# Patient Record
Sex: Female | Born: 1955 | Race: White | Hispanic: No | Smoking: Former smoker
Health system: Southern US, Community
[De-identification: ages and names within clinical notes are randomized; demographics above are authoritative.]

## PROBLEM LIST (undated history)

## (undated) DIAGNOSIS — T7840XA Allergy, unspecified, initial encounter: Secondary | ICD-10-CM

## (undated) DIAGNOSIS — F419 Anxiety disorder, unspecified: Secondary | ICD-10-CM

## (undated) DIAGNOSIS — I10 Essential (primary) hypertension: Secondary | ICD-10-CM

## (undated) HISTORY — PX: TONSILLECTOMY: SUR1361

## (undated) HISTORY — DX: Essential (primary) hypertension: I10

## (undated) HISTORY — DX: Allergy, unspecified, initial encounter: T78.40XA

## (undated) HISTORY — DX: Anxiety disorder, unspecified: F41.9

---

## 2001-02-10 ENCOUNTER — Other Ambulatory Visit: Admission: RE | Admit: 2001-02-10 | Discharge: 2001-02-10 | Payer: Self-pay | Admitting: *Deleted

## 2001-12-17 LAB — LAB REPORT - SCANNED: TSH: 2.73 (ref 0.41–5.90)

## 2002-03-07 ENCOUNTER — Other Ambulatory Visit: Admission: RE | Admit: 2002-03-07 | Discharge: 2002-03-07 | Payer: Self-pay | Admitting: Obstetrics and Gynecology

## 2003-05-09 ENCOUNTER — Other Ambulatory Visit: Admission: RE | Admit: 2003-05-09 | Discharge: 2003-05-09 | Payer: Self-pay | Admitting: Obstetrics and Gynecology

## 2004-06-17 ENCOUNTER — Other Ambulatory Visit: Admission: RE | Admit: 2004-06-17 | Discharge: 2004-06-17 | Payer: Self-pay | Admitting: Obstetrics and Gynecology

## 2007-01-28 HISTORY — PX: POSTERIOR REPAIR: SHX2254

## 2007-06-01 ENCOUNTER — Ambulatory Visit (HOSPITAL_COMMUNITY): Admission: RE | Admit: 2007-06-01 | Discharge: 2007-06-02 | Payer: Self-pay | Admitting: Obstetrics and Gynecology

## 2010-06-11 NOTE — Op Note (Signed)
NAME:  Jane Obrien, Jane Obrien               ACCOUNT NO.:  [id number]   MEDICAL RECORD NO.:  [id number]          PATIENT TYPE:  OIB   LOCATION:  9310                          FACILITY:  [facility]   PHYSICIAN:  Michelle L. Grewal, M.D.DATE OF BIRTH:  03-28-1955   DATE OF PROCEDURE:  06/01/2007  DATE OF DISCHARGE:                               OPERATIVE REPORT   PREOPERATIVE DIAGNOSIS:  Symptomatic rectocele.   POSTOPERATIVE DIAGNOSIS:  Symptomatic rectocele.   PROCEDURE:  Posterior repair.   SURGEON:  Michelle L. Vincente Poli, MD   ANESTHESIA:  General.   FINDINGS:  Grade II-III rectocele.   PROCEDURE:  The patient was taken to the operating room after informed  consent was obtained.  She was intubated and prepped and draped in the  usual sterile fashion.  Exam under anesthesia revealed she had a grade  II-III rectocele.  She had no cystocele.  The cervix and uterus were  deep in the vagina.  After she was draped, Allis clamps were placed in  the 5 and 7 o'clock position on the perineum.  A V-shaped incision was  made with the scalpel and then a midline incision was made at the  posterior wall of the vagina using Metzenbaum scissors, dissecting the  overlying vaginal epithelium free from the rectovaginal fascia.  We then  found out with the Allis clamps the vaginal epithelium and then reduced  the rectocele bilaterally with sharp and blunt dissection.  There was  minimal bleeding noted with this.  At  this point, I then reduced the  rectocele using a series of a interrupted using 0 Vicryl suture starting  distally and proximally to the introitus.  The redundant vaginal  epithelium was then trimmed carefully with Metzenbaum scissors.  The  vaginal epithelium was closed using 2-0 Vicryl in a running locked  stitch and the perineum was reapproximated as well.  There was no  bleeding noted at the end of the procedure.  She had excellent reduction  of the rectocele at the time of conclusion of the  surgery.  Vaginal  packing 2-inch tape with Estrace cream was carefully inserted into the  vagina.  She will wear this overnight.  I will keep her for overnight  observation as discussed with the patient previously and she will go  home tomorrow.      Michelle L. Vincente Poli, M.D.  Electronically Signed     MLG/MEDQ  D:  06/01/2007  T:  06/01/2007  Job:  [id number]

## 2011-02-04 ENCOUNTER — Other Ambulatory Visit (HOSPITAL_COMMUNITY): Payer: Self-pay | Admitting: Obstetrics and Gynecology

## 2011-02-04 DIAGNOSIS — Z1231 Encounter for screening mammogram for malignant neoplasm of breast: Secondary | ICD-10-CM

## 2011-03-05 ENCOUNTER — Ambulatory Visit (HOSPITAL_COMMUNITY)
Admission: RE | Admit: 2011-03-05 | Discharge: 2011-03-05 | Disposition: A | Discharge status: Home / Self Care | Payer: Self-pay | Source: Ambulatory Visit | Attending: Obstetrics and Gynecology | Admitting: Obstetrics and Gynecology

## 2011-03-05 DIAGNOSIS — Z1231 Encounter for screening mammogram for malignant neoplasm of breast: Secondary | ICD-10-CM

## 2012-05-20 ENCOUNTER — Other Ambulatory Visit (HOSPITAL_COMMUNITY): Payer: Self-pay | Admitting: Obstetrics and Gynecology

## 2012-05-20 DIAGNOSIS — Z1231 Encounter for screening mammogram for malignant neoplasm of breast: Secondary | ICD-10-CM

## 2012-05-26 ENCOUNTER — Ambulatory Visit (HOSPITAL_COMMUNITY): Payer: Self-pay

## 2012-06-03 ENCOUNTER — Other Ambulatory Visit: Payer: Self-pay | Admitting: Obstetrics and Gynecology

## 2012-06-03 ENCOUNTER — Ambulatory Visit (HOSPITAL_COMMUNITY)
Admission: RE | Admit: 2012-06-03 | Discharge: 2012-06-03 | Disposition: A | Discharge status: Home / Self Care | Payer: Self-pay | Source: Ambulatory Visit | Attending: Obstetrics and Gynecology | Admitting: Obstetrics and Gynecology

## 2012-06-03 DIAGNOSIS — Z1231 Encounter for screening mammogram for malignant neoplasm of breast: Secondary | ICD-10-CM

## 2012-06-03 DIAGNOSIS — R928 Other abnormal and inconclusive findings on diagnostic imaging of breast: Secondary | ICD-10-CM

## 2012-06-22 ENCOUNTER — Encounter (HOSPITAL_COMMUNITY): Payer: Self-pay

## 2012-06-22 ENCOUNTER — Ambulatory Visit (HOSPITAL_COMMUNITY)
Admission: RE | Admit: 2012-06-22 | Discharge: 2012-06-22 | Disposition: A | Discharge status: Home / Self Care | Payer: Self-pay | Source: Ambulatory Visit | Attending: Obstetrics and Gynecology | Admitting: Obstetrics and Gynecology

## 2012-06-22 VITALS — BP 124/78 | Temp 98.1°F | Ht 61.5 in | Wt 175.0 lb

## 2012-06-22 DIAGNOSIS — N6325 Unspecified lump in the left breast, overlapping quadrants: Secondary | ICD-10-CM | POA: Insufficient documentation

## 2012-06-22 DIAGNOSIS — Z1239 Encounter for other screening for malignant neoplasm of breast: Secondary | ICD-10-CM

## 2012-06-22 NOTE — Progress Notes (Signed)
Patient referred to [facility] by the [facility] due to needing additional imaging of the left breast. Screening mammogram completed 06/03/2012 at the [facility].  Pap Smear:    Pap smear not completed today. Last Pap smear was December 2012 at [facility] and normal per patient. Per patient has a history of an abnormal Pap smear 20 years ago that required cryo therapy for follow up. Per patient all Pap smears have been normal since cryo was completed. No Pap smear results in EPIC. Patient has signed a release for previous Pap smear results to determine when next Pap smear is due.  Physical exam: Breasts Breasts symmetrical. No skin abnormalities bilateral breasts. No nipple retraction bilateral breasts. No nipple discharge bilateral breasts. No lymphadenopathy. No lumps palpated right breast. Palpated lump within the left breast at 9 o'clock 6 cm from the nipple. Patient complained of pain and tenderness on exam over entire breast. She stated her right breast was more tender. Unable to complete a thorough breast exam due to patients complaints of breast tenderness on exam. Referred patient to the [facility] of [facility] for left breast diagnostic mammogram and possible ultrasound per recommendation. Appointment scheduled for Wednesday, Jun 23, 2012 at 0920.      Pelvic/Bimanual No Pap smear completed today since last Pap smear was December 2012. Pap smear not indicated per BCCCP guidelines.

## 2012-06-22 NOTE — Patient Instructions (Signed)
Taught patient how to perform BSE. Patient did not need a Pap smear today due to last Pap smear was December 2012 per patient. Patient has signed release to get results of last Pap smear. Let patient know will follow up with her when Pap smear is due based on her history. Referred patient to the [facility] of [facility] for left breast diagnostic mammogram and possible ultrasound per recommendation. Appointment scheduled for Wednesday, Jun 23, 2012 at 0920. Patient aware of appointment and will be there. Smoking cessation discussed with patient and gave her information of upcoming smoking cessation classes offered at the [facility]. Patient verbalized understanding.

## 2012-06-23 ENCOUNTER — Ambulatory Visit
Admission: RE | Admit: 2012-06-23 | Discharge: 2012-06-23 | Disposition: A | Discharge status: Home / Self Care | Payer: No Typology Code available for payment source | Source: Ambulatory Visit | Attending: Obstetrics and Gynecology | Admitting: Obstetrics and Gynecology

## 2012-06-23 DIAGNOSIS — R928 Other abnormal and inconclusive findings on diagnostic imaging of breast: Secondary | ICD-10-CM

## 2012-11-17 ENCOUNTER — Other Ambulatory Visit: Payer: Self-pay | Admitting: Obstetrics and Gynecology

## 2012-11-17 DIAGNOSIS — R921 Mammographic calcification found on diagnostic imaging of breast: Secondary | ICD-10-CM

## 2012-12-27 ENCOUNTER — Other Ambulatory Visit: Payer: Self-pay | Admitting: Obstetrics and Gynecology

## 2012-12-27 ENCOUNTER — Ambulatory Visit
Admission: RE | Admit: 2012-12-27 | Discharge: 2012-12-27 | Disposition: A | Discharge status: Home / Self Care | Payer: No Typology Code available for payment source | Source: Ambulatory Visit | Attending: Obstetrics and Gynecology | Admitting: Obstetrics and Gynecology

## 2012-12-27 DIAGNOSIS — R921 Mammographic calcification found on diagnostic imaging of breast: Secondary | ICD-10-CM

## 2012-12-28 ENCOUNTER — Ambulatory Visit
Admission: RE | Admit: 2012-12-28 | Discharge: 2012-12-28 | Disposition: A | Discharge status: Home / Self Care | Payer: No Typology Code available for payment source | Source: Ambulatory Visit | Attending: Obstetrics and Gynecology | Admitting: Obstetrics and Gynecology

## 2012-12-28 DIAGNOSIS — R921 Mammographic calcification found on diagnostic imaging of breast: Secondary | ICD-10-CM

## 2013-01-18 ENCOUNTER — Other Ambulatory Visit: Payer: Self-pay | Admitting: Diagnostic Radiology

## 2013-01-18 ENCOUNTER — Ambulatory Visit
Admission: RE | Admit: 2013-01-18 | Discharge: 2013-01-18 | Disposition: A | Discharge status: Home / Self Care | Payer: No Typology Code available for payment source | Source: Ambulatory Visit | Attending: Obstetrics and Gynecology | Admitting: Obstetrics and Gynecology

## 2013-01-18 ENCOUNTER — Other Ambulatory Visit: Payer: Self-pay | Admitting: Obstetrics and Gynecology

## 2013-01-18 DIAGNOSIS — Z9889 Other specified postprocedural states: Secondary | ICD-10-CM

## 2013-01-18 MED ORDER — DOXYCYCLINE HYCLATE 50 MG PO CAPS: 50.0000 mg | ORAL_CAPSULE | Freq: Two times a day (BID) | ORAL | Status: DC

## 2013-01-24 ENCOUNTER — Ambulatory Visit
Admission: RE | Admit: 2013-01-24 | Discharge: 2013-01-24 | Disposition: A | Discharge status: Home / Self Care | Payer: No Typology Code available for payment source | Source: Ambulatory Visit | Attending: Obstetrics and Gynecology | Admitting: Obstetrics and Gynecology

## 2013-01-24 ENCOUNTER — Other Ambulatory Visit: Payer: Self-pay | Admitting: Obstetrics and Gynecology

## 2013-01-24 DIAGNOSIS — Z9889 Other specified postprocedural states: Secondary | ICD-10-CM

## 2013-02-04 ENCOUNTER — Ambulatory Visit
Admission: RE | Admit: 2013-02-04 | Discharge: 2013-02-04 | Disposition: A | Discharge status: Home / Self Care | Payer: No Typology Code available for payment source | Source: Ambulatory Visit | Attending: Obstetrics and Gynecology | Admitting: Obstetrics and Gynecology

## 2013-02-04 DIAGNOSIS — Z9889 Other specified postprocedural states: Secondary | ICD-10-CM

## 2013-11-15 ENCOUNTER — Other Ambulatory Visit: Payer: Self-pay | Admitting: Obstetrics and Gynecology

## 2013-11-16 LAB — CYTOLOGY - PAP

## 2013-11-28 ENCOUNTER — Encounter (HOSPITAL_COMMUNITY): Payer: Self-pay

## 2015-04-01 IMAGING — MG MM DIAGNOSTIC UNILATERAL L
6 series · 6 of 6 positions shown · non-contrast
Comparison: 06/23/2012, 06/03/2012, 03/05/2011, 11/17/2008

ACR Breast Density Category a: The breast tissue is almost entirely
fatty.

CLINICAL DATA: Patient returns for short interval followup of
calcifications in the left breast.

EXAM:
DIGITAL DIAGNOSTIC  LEFT MAMMOGRAM WITH CAD

[L ML (1 of 2)]
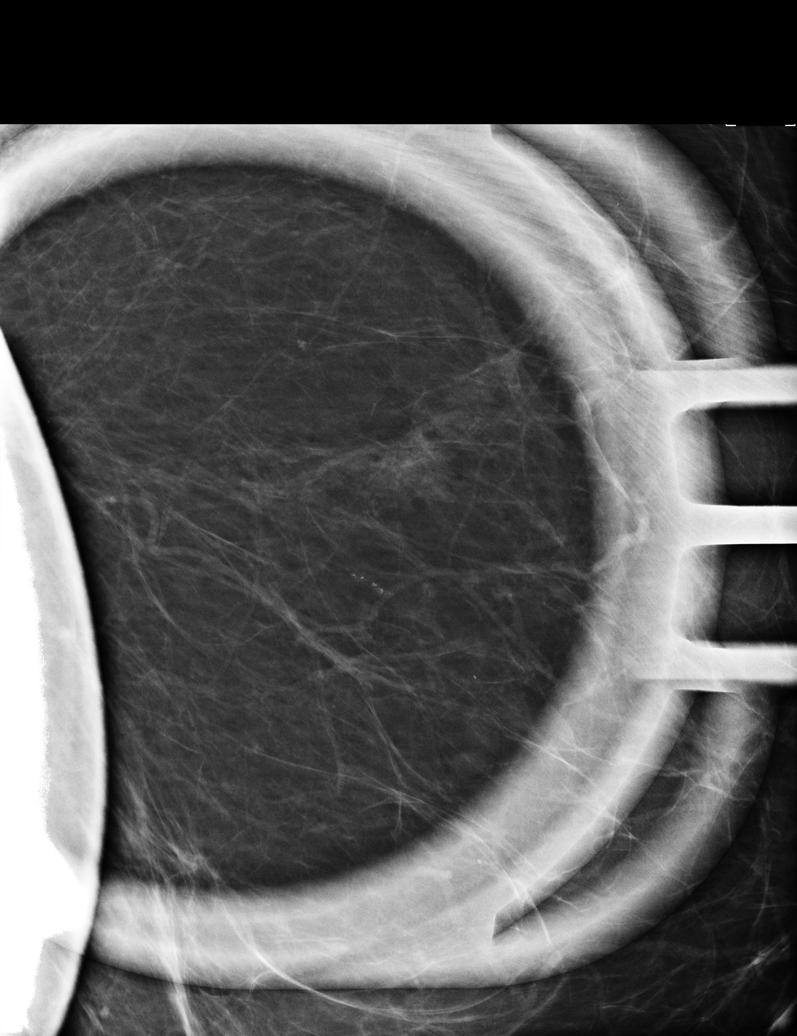

[L CC (1 of 2)]
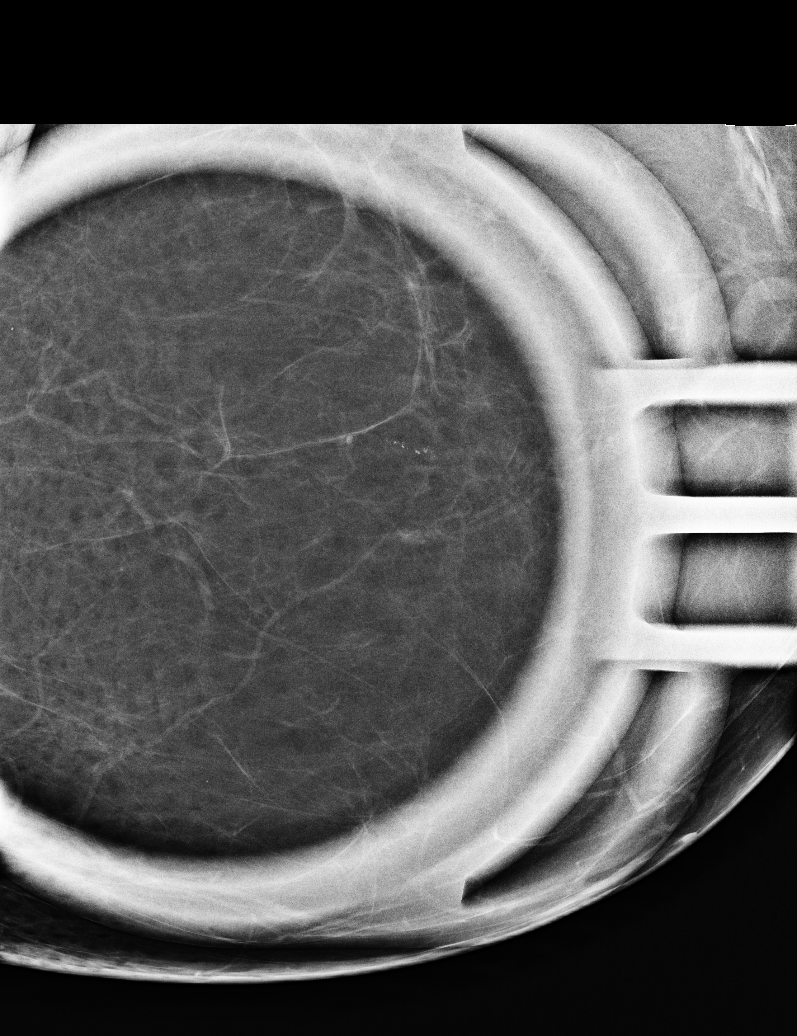

[L ML (2 of 2)]
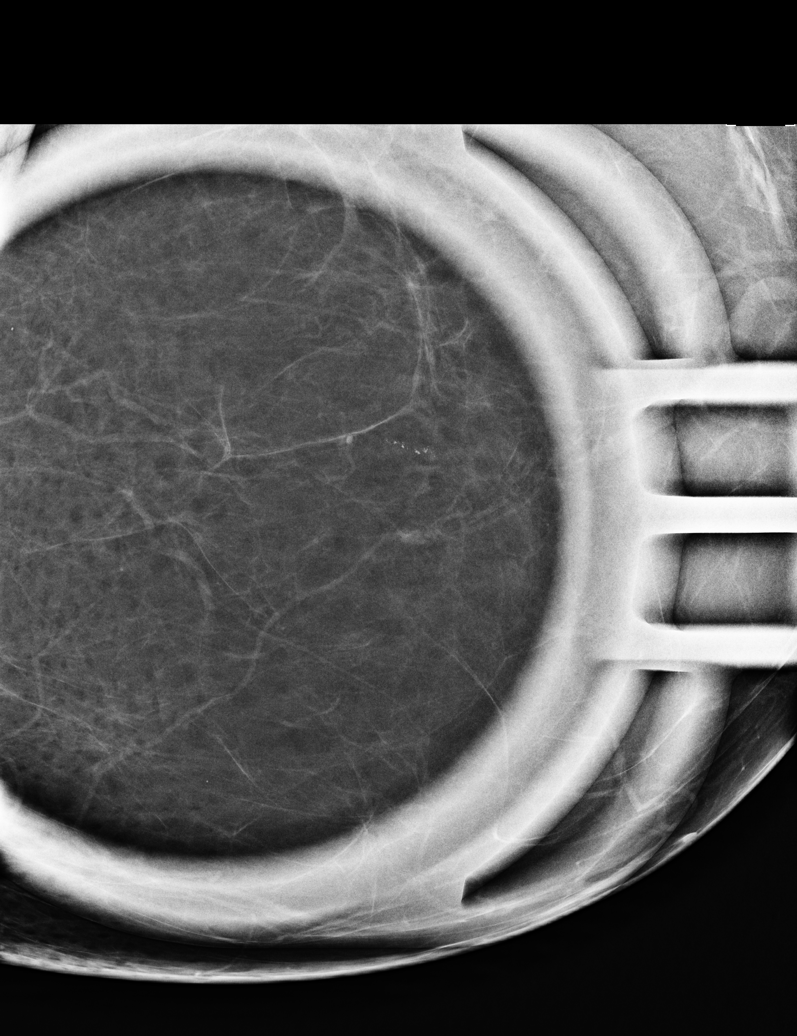

[L MLO (1 of 2)]
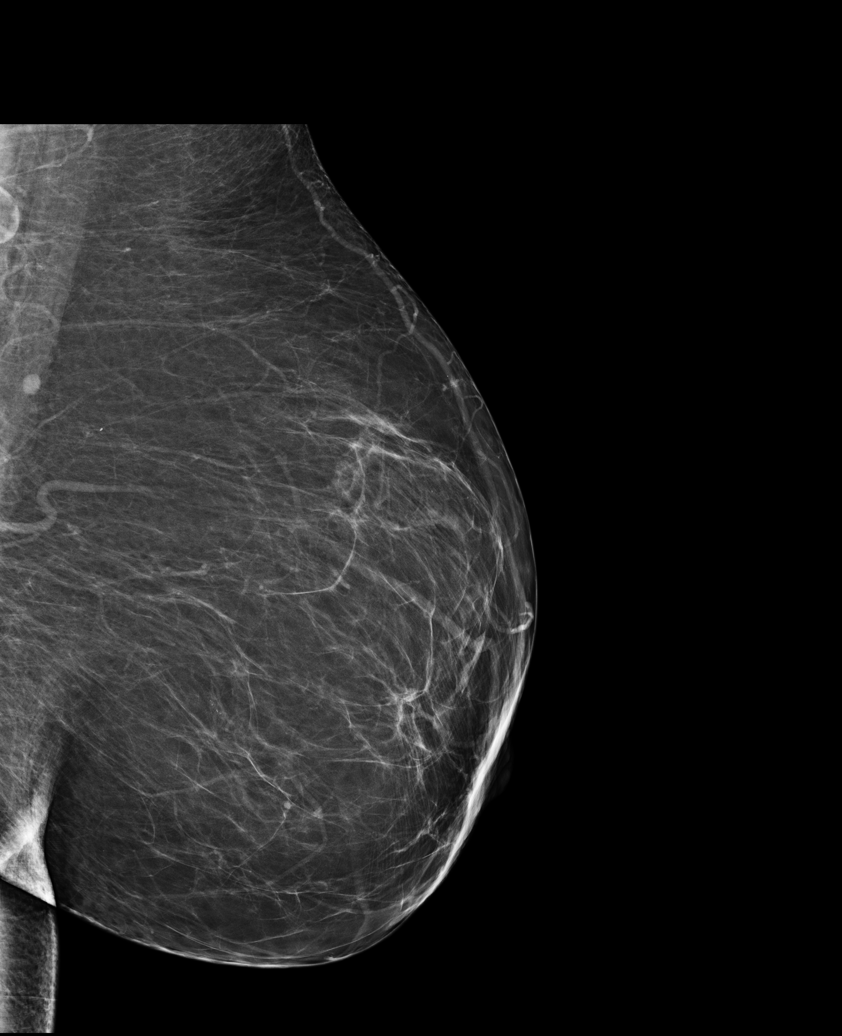

[L MLO (2 of 2)]
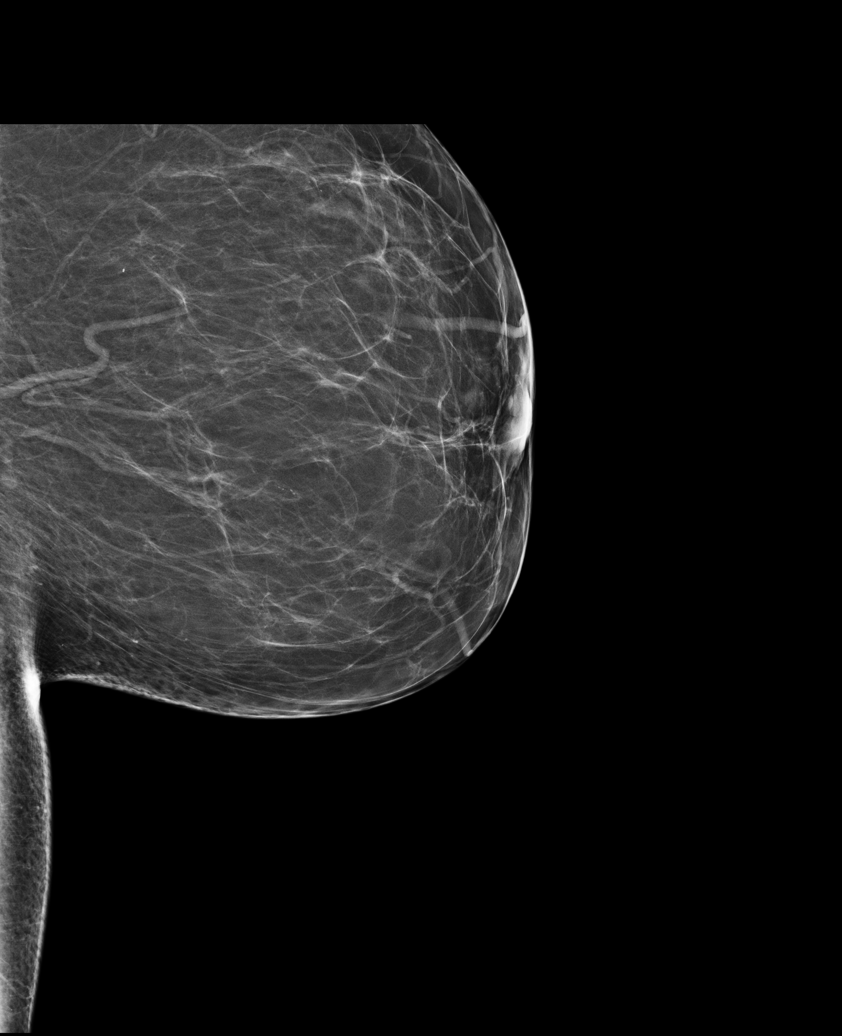

[L CC (2 of 2)]
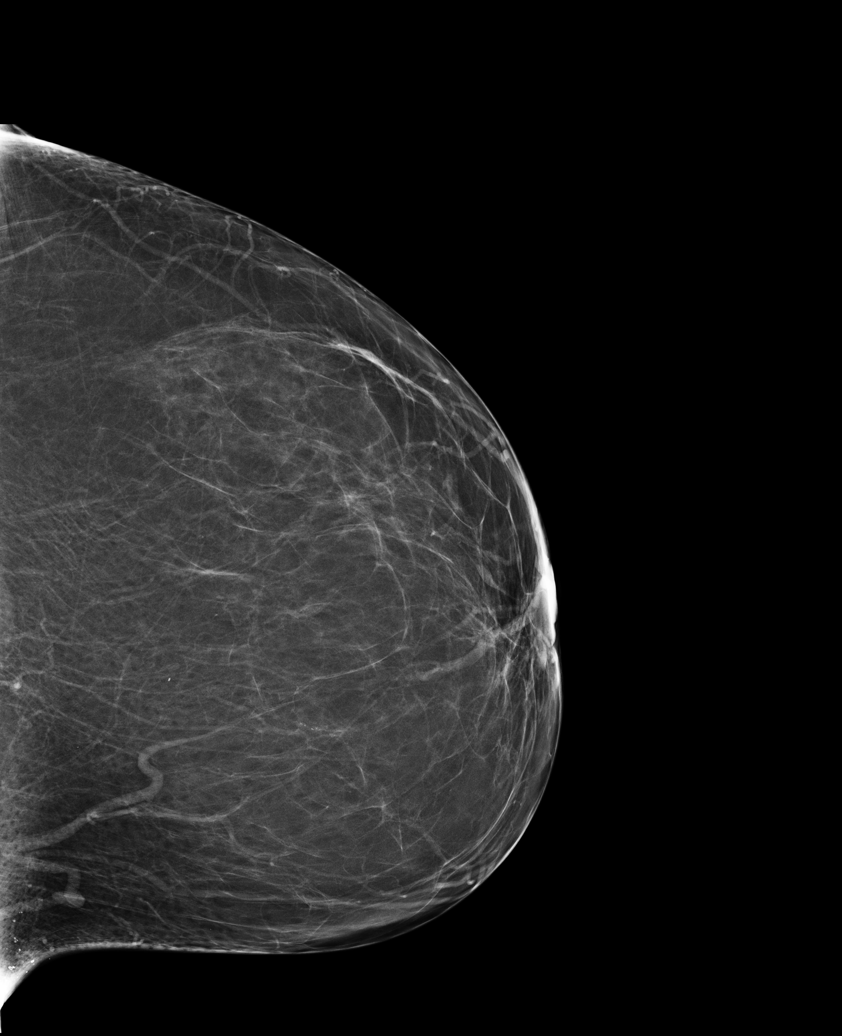

[6 of 6 positions shown; findings below may reference images not displayed]

FINDINGS: There are pleomorphic microcalcifications in a linear orientation in
the medial aspect of the left breast in the middle third. These do
not appear to be associated with a vessel on magnification views.
These cover an area of 7 x 1 x 1 mm. While this may represent a
degenerating fibroadenoma, ductal carcinoma in situ is not excluded.
Biopsy is recommended. Stereotactic core needle biopsy was discussed
with the patient and she agreed with this plan.

Mammographic images were processed with CAD.
IMPRESSION: Slightly suspicious calcifications in the left breast medially in
the middle third.

RECOMMENDATION:
Stereotactic core needle biopsy is suggested. This has been
scheduled for 12/28/2012 at 1 p.m..

I have discussed the findings and recommendations with the patient.
Results were also provided in writing at the conclusion of the
visit. If applicable, a reminder letter will be sent to the patient
regarding the next appointment.

BI-RADS CATEGORY  4: Suspicious abnormality - biopsy should be
considered.

## 2015-04-02 IMAGING — MG MM BX 1ST LESION STEREO
1 series · 1 of 1 positions shown · non-contrast
Comparison: Previous exams.

ADDENDUM:
Pathology revealed hyalinized and calcified fibroadenomatoid nodules
with calcifications in the left breast. This was found to be
concordant by Dr. Sahir Nation. Pathology was relayed to the
patient by telephone. The patient reported tenderness and bruising
at the biopsy site. Post biopsy instructions were reviewed and her
questions were answered. She was encouraged to call [facility]
asked to return in Monday May, 2013 for screening mammography.

Pathology results were reported by Liam Reno Jankins RN, BSN on [REDACTED]AL DATA:  Patient presents for stereotactic core needle biopsy
of a 7 mm group of indeterminate microcalcifications over the inner
mid left breast.
EXAM:
Left STEREOTACTIC CORE NEEDLE BIOPSY

[L ML]
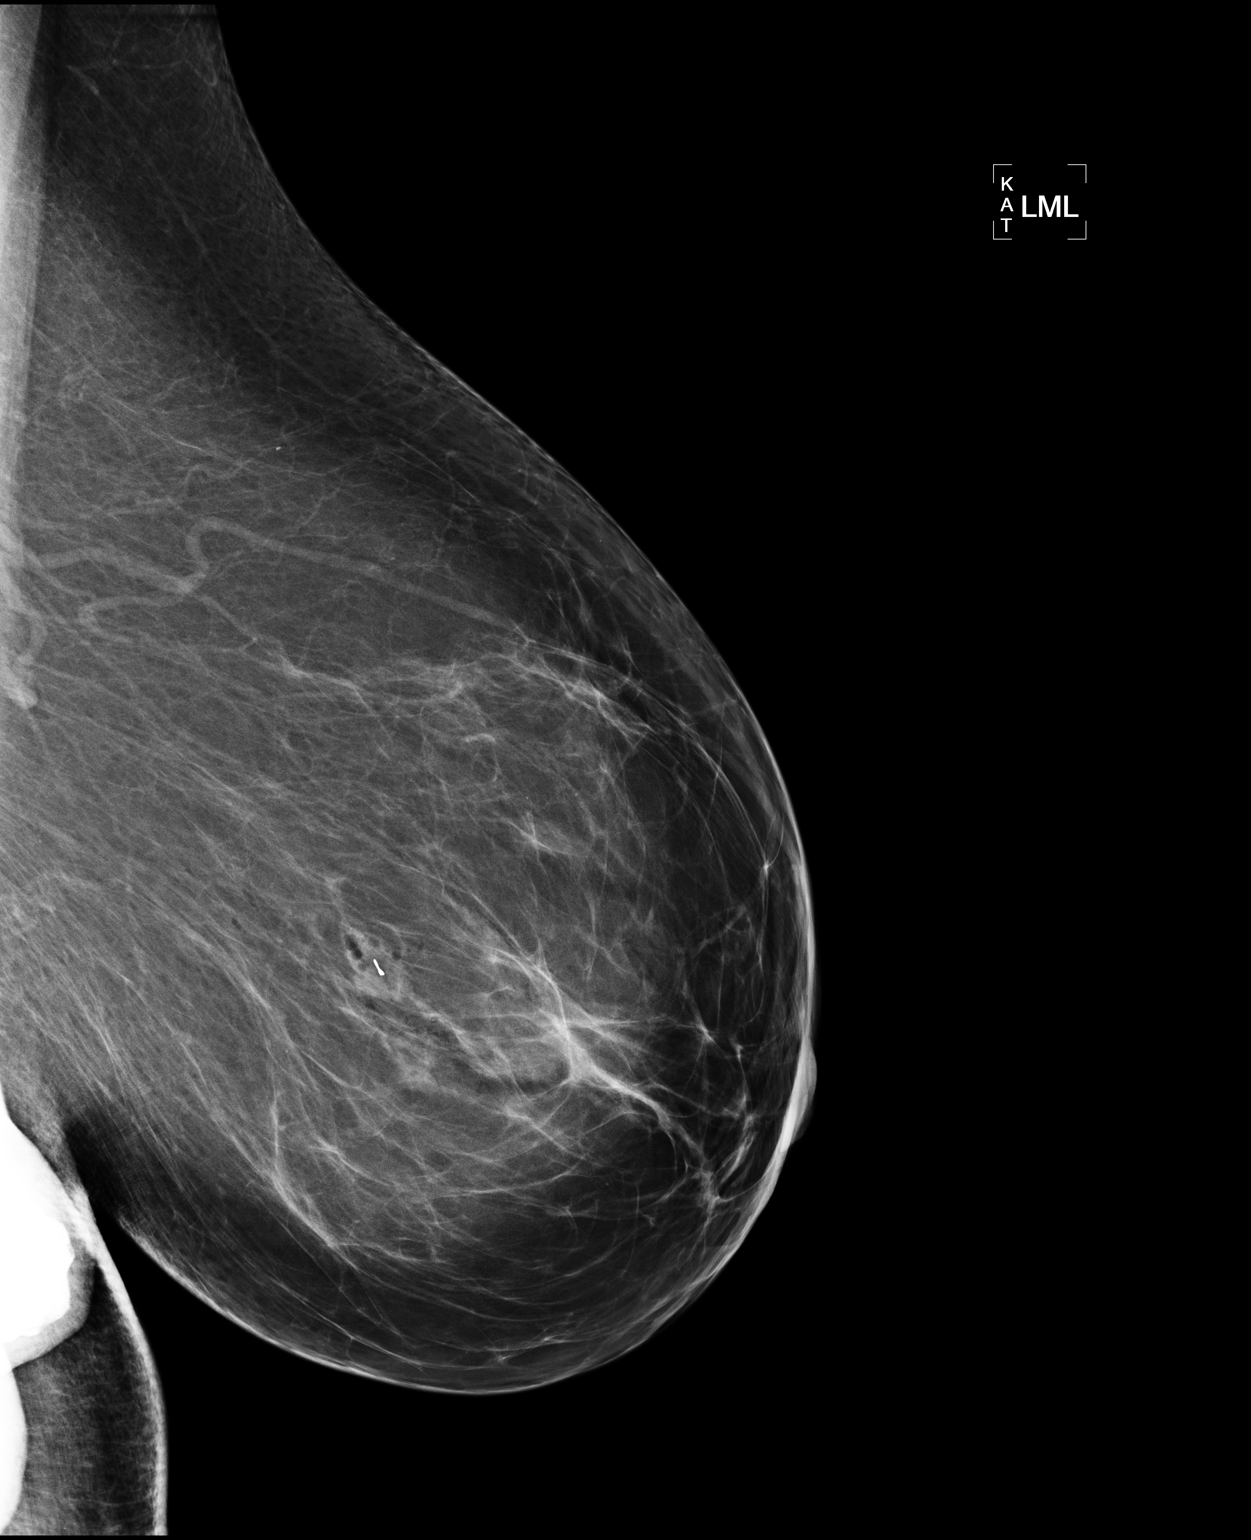

[1 of 1 positions shown; findings below may reference images not displayed]



Using sterile technique and 2% Lidocaine as local anesthetic, under
stereotactic guidance, a 10 gauge vacuum assisted needle device was
used to perform core needle biopsy of microcalcifications over the
inner mid left breast using a medial to lateral approach. Specimen
radiograph was performed showing multiple of the targeted
microcalcifications. Specimens with calcifications are identified
for pathology.

At the conclusion of the procedure, a dumbbell-shaped tissue marker
clip was deployed into the biopsy cavity. Follow-up 2-view mammogram
confirmed clip placement accurately at the biopsy site.
IMPRESSION: Stereotactic-guided biopsy of indeterminate left breast
microcalcifications. No apparent complications.

## 2016-01-18 LAB — LAB REPORT - SCANNED
Albumin, Urine POC: 0.7
Albumin/Creatinine Ratio, Urine, POC: UNDETERMINED
Creatinine, POC: 32 mg/dL
EGFR (Non-African Amer.): 70

## 2019-04-02 ENCOUNTER — Ambulatory Visit: Payer: Self-pay | Attending: Internal Medicine

## 2019-04-02 DIAGNOSIS — Z23 Encounter for immunization: Secondary | ICD-10-CM | POA: Insufficient documentation

## 2019-04-02 NOTE — Progress Notes (Signed)
   Covid-19 Vaccination Clinic  Name:  Jane Obrien    MRN: [id number] DOB: 1955-09-12  04/02/2019  [name] was observed post Covid-19 immunization for 30 minutes based on pre-vaccination screening without incident. She was provided with Vaccine Information Sheet and instruction to access the V-Safe system.   [name] was instructed to call 911 with any severe reactions post vaccine: Marland Kitchen Difficulty breathing  . Swelling of face and throat  . A fast heartbeat  . A bad rash all over body  . Dizziness and weakness   Immunizations Administered    Name Date Dose VIS Date Route   Pfizer COVID-19 Vaccine 04/02/2019  1:36 PM 0.3 mL 01/07/2019 Intramuscular   Manufacturer: [employer], [employer]   Lot: [id number]   NDC: [address]-2

## 2019-04-23 ENCOUNTER — Ambulatory Visit: Payer: Self-pay | Attending: Internal Medicine

## 2019-04-23 DIAGNOSIS — Z23 Encounter for immunization: Secondary | ICD-10-CM

## 2019-04-23 NOTE — Progress Notes (Signed)
   Covid-19 Vaccination Clinic  Name:  DALLYS NOWAKOWSKI    MRN: [id number] DOB: 1955/11/14  04/23/2019  Ms. Stegemann was observed post Covid-19 immunization for 15 minutes without incident. She was provided with Vaccine Information Sheet and instruction to access the V-Safe system.   Ms. Carrol was instructed to call 911 with any severe reactions post vaccine: Marland Kitchen Difficulty breathing  . Swelling of face and throat  . A fast heartbeat  . A bad rash all over body  . Dizziness and weakness   Immunizations Administered    Name Date Dose VIS Date Route   Pfizer COVID-19 Vaccine 04/23/2019  1:02 PM 0.3 mL 01/07/2019 Intramuscular   Manufacturer: [employer], [employer]   Lot: [id number]   NDC: [address]-7

## 2019-05-02 ENCOUNTER — Ambulatory Visit: Payer: Self-pay

## 2019-05-03 ENCOUNTER — Ambulatory Visit: Payer: Self-pay

## 2019-11-12 ENCOUNTER — Ambulatory Visit: Payer: Self-pay | Attending: Internal Medicine

## 2019-11-12 DIAGNOSIS — Z23 Encounter for immunization: Secondary | ICD-10-CM

## 2019-11-12 NOTE — Progress Notes (Signed)
   Covid-19 Vaccination Clinic  Name:  Jane Obrien    MRN: [id number] DOB: 14-Dec-1955  11/12/2019  Jane Obrien was observed post Covid-19 immunization for 15 minutes without incident. She was provided with Vaccine Information Sheet and instruction to access the V-Safe system.   Jane Obrien was instructed to call 911 with any severe reactions post vaccine: Marland Kitchen Difficulty breathing  . Swelling of face and throat  . A fast heartbeat  . A bad rash all over body  . Dizziness and weakness

## 2020-01-17 LAB — COLOGUARD: Cologuard: NEGATIVE

## 2020-01-24 LAB — COLOGUARD: COLOGUARD: NEGATIVE

## 2020-01-24 LAB — EXTERNAL GENERIC LAB PROCEDURE: COLOGUARD: NEGATIVE

## 2022-05-14 ENCOUNTER — Inpatient Hospital Stay: Payer: Medicare Other | Attending: Hematology & Oncology

## 2022-05-14 ENCOUNTER — Encounter: Payer: Self-pay | Admitting: Family

## 2022-05-14 ENCOUNTER — Other Ambulatory Visit: Payer: Self-pay | Admitting: Family

## 2022-05-14 ENCOUNTER — Other Ambulatory Visit: Payer: Self-pay

## 2022-05-14 ENCOUNTER — Inpatient Hospital Stay (HOSPITAL_BASED_OUTPATIENT_CLINIC_OR_DEPARTMENT_OTHER): Payer: Medicare Other | Admitting: Family

## 2022-05-14 VITALS — BP 109/72 | HR 80 | Temp 98.5°F | Resp 17 | Ht 62.0 in | Wt 154.4 lb

## 2022-05-14 DIAGNOSIS — R1011 Right upper quadrant pain: Secondary | ICD-10-CM | POA: Diagnosis not present

## 2022-05-14 DIAGNOSIS — R7989 Other specified abnormal findings of blood chemistry: Secondary | ICD-10-CM | POA: Diagnosis not present

## 2022-05-14 DIAGNOSIS — Z87891 Personal history of nicotine dependence: Secondary | ICD-10-CM

## 2022-05-14 DIAGNOSIS — Z800 Family history of malignant neoplasm of digestive organs: Secondary | ICD-10-CM

## 2022-05-14 DIAGNOSIS — F1010 Alcohol abuse, uncomplicated: Secondary | ICD-10-CM | POA: Insufficient documentation

## 2022-05-14 DIAGNOSIS — F1011 Alcohol abuse, in remission: Secondary | ICD-10-CM | POA: Diagnosis not present

## 2022-05-14 LAB — CBC WITH DIFFERENTIAL (CANCER CENTER ONLY)
Abs Immature Granulocytes: 0.02 10*3/uL (ref 0.00–0.07)
Basophils Absolute: 0.1 10*3/uL (ref 0.0–0.1)
Basophils Relative: 1 %
Eosinophils Absolute: 0.2 10*3/uL (ref 0.0–0.5)
Eosinophils Relative: 3 %
HCT: 38 % (ref 36.0–46.0)
Hemoglobin: 12.9 g/dL (ref 12.0–15.0)
Immature Granulocytes: 0 %
Lymphocytes Relative: 31 %
Lymphs Abs: 2.1 10*3/uL (ref 0.7–4.0)
MCH: 30.8 pg (ref 26.0–34.0)
MCHC: 33.9 g/dL (ref 30.0–36.0)
MCV: 90.7 fL (ref 80.0–100.0)
Monocytes Absolute: 0.7 10*3/uL (ref 0.1–1.0)
Monocytes Relative: 10 %
Neutro Abs: 3.7 10*3/uL (ref 1.7–7.7)
Neutrophils Relative %: 55 %
Platelet Count: 245 10*3/uL (ref 150–400)
RBC: 4.19 MIL/uL (ref 3.87–5.11)
RDW: 12.3 % (ref 11.5–15.5)
WBC Count: 6.6 10*3/uL (ref 4.0–10.5)
nRBC: 0 % (ref 0.0–0.2)

## 2022-05-14 LAB — CMP (CANCER CENTER ONLY)
ALT: 12 U/L (ref 0–44)
AST: 16 U/L (ref 15–41)
Albumin: 4.4 g/dL (ref 3.5–5.0)
Alkaline Phosphatase: 60 U/L (ref 38–126)
Anion gap: 10 (ref 5–15)
BUN: 12 mg/dL (ref 8–23)
CO2: 28 mmol/L (ref 22–32)
Calcium: 9.7 mg/dL (ref 8.9–10.3)
Chloride: 103 mmol/L (ref 98–111)
Creatinine: 1.03 mg/dL — ABNORMAL HIGH (ref 0.44–1.00)
GFR, Estimated: 60 mL/min — ABNORMAL LOW (ref >60)
Glucose, Bld: 98 mg/dL (ref 70–99)
Potassium: 4.1 mmol/L (ref 3.5–5.1)
Sodium: 141 mmol/L (ref 135–145)
Total Bilirubin: 0.8 mg/dL (ref 0.3–1.2)
Total Protein: 7.4 g/dL (ref 6.5–8.1)

## 2022-05-14 LAB — LACTATE DEHYDROGENASE: LDH: 132 U/L (ref 98–192)

## 2022-05-14 LAB — FERRITIN: Ferritin: 677 ng/mL — ABNORMAL HIGH (ref 11–307)

## 2022-05-14 NOTE — Progress Notes (Signed)
Hematology/Oncology Consultation   Name: Jane Obrien      MRN: [id number]    Location: Room/bed info not found  Date: 05/14/2022 Time:3:22 PM   REFERRING PHYSICIAN:  Marcelle Overlie, MD  REASON FOR CONSULT: High serum ferritin    DIAGNOSIS: High serum ferritin   HISTORY OF PRESENT ILLNESS: Jane Obrien is a very pleasant 67 yo caucasian female with recently elevated ferritin.  She had been having hair thinning and on work up with iron studies and TSH showed a ferritin of 834. On repeat 2 months later her ferritin was up to 1288.  She is not on any iron supplementation.  Hgb is stable at 12.9, MCV 90, platelets 245 an WBC count 6.6.  She has history of ETOH abuse but quit 2 months ago. We are so proud of her for making this decision.  She notes occasional twinges of abdominal pain in the right upper quadrant.  No known personal or familial history of liver disease or thrombotic event. She notes some SOB and occasional palpitations at times.  She denies fatigue.  No blood loss noted. No bruising or petechiae.  She has occasional dizziness with Meniere's disease.  She has four adult children all of whom are healthy.  No history of diabetes or thyroid disease.  No personal history of cancer. Her maternal grandmother had colon cancer.  She is not interested in having a colonoscopy but states that her Cologuard test a few years ago (2021) was negative.  She states that her mammogram is up to date and that most recent was negative.  No fever, chills, n/v, cough, rash, chest pain or changes in bowel or bladder habits.  She has chronic constipation and takes Miralax regularly to prevent.  No swelling, numbness or tingling in her extremities.  She has generalized joint aches and pains with arthritis.  No falls or syncope reported.  No recreational drug use.  Appetite and hydration are good. She has been eating healthier to lose weight. Weight is 154 lbs.   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   ROS: All other 10 point review of systems is negative.   PAST MEDICAL HISTORY:   No past medical history on file.  ALLERGIES: Allergies  Allergen Reactions   Clindamycin/Lincomycin    Flagyl [Metronidazole]       MEDICATIONS:  Current Outpatient Medications on File Prior to Visit  Medication Sig Dispense Refill   ALPRAZolam (XANAX) 0.5 MG tablet Take 0.5 mg by mouth at bedtime as needed for sleep.     estradiol (ESTRACE VAGINAL) 0.1 MG/GM vaginal cream Place 1 Applicatorful vaginally daily.     LOSARTAN POTASSIUM-HCTZ PO      doxycycline (VIBRAMYCIN) 50 MG capsule Take 1 capsule (50 mg total) by mouth 2 (two) times daily. 14 capsule 0   No current facility-administered medications on file prior to visit.     PAST SURGICAL HISTORY Past Surgical History:  Procedure Laterality Date   POSTERIOR REPAIR  2009   TONSILLECTOMY      FAMILY HISTORY: Family History  Problem Relation Age of Onset   Diabetes Mother    Cancer Maternal Grandmother        colon    SOCIAL HISTORY:  reports that she has quit smoking. Her smoking use included cigarettes. She has a 40.00 pack-year smoking history. She does not have any smokeless tobacco history on file. She reports that she does not currently use alcohol after a past usage of about  72.0 standard drinks of alcohol per week. She reports that she does not use drugs.  PERFORMANCE STATUS: The patient's performance status is 1 - Symptomatic but completely ambulatory  PHYSICAL EXAM: Most Recent Vital Signs: There were no vitals taken for this visit. There were no vitals taken for this visit.  General Appearance:    Alert, cooperative, no distress, appears stated age  Head:    Normocephalic, without obvious abnormality, atraumatic  Eyes:    PERRL, conjunctiva/corneas clear, EOM's intact, fundi    benign, both eyes        Throat:   Lips, mucosa, and tongue normal; teeth and gums normal  Neck:   Supple,  symmetrical, trachea midline, no adenopathy;    thyroid:  no enlargement/tenderness/nodules; no carotid   bruit or JVD  Back:     Symmetric, no curvature, ROM normal, no CVA tenderness  Lungs:     Clear to auscultation bilaterally, respirations unlabored  Chest Wall:    No tenderness or deformity   Heart:    Regular rate and rhythm, S1 and S2 normal, no murmur, rub   or gallop     Abdomen:     Soft, non-tender, bowel sounds active all four quadrants,    no masses, no organomegaly        Extremities:   Extremities normal, atraumatic, no cyanosis or edema  Pulses:   2+ and symmetric all extremities  Skin:   Skin color, texture, turgor normal, no rashes or lesions  Lymph nodes:   Cervical, supraclavicular, and axillary nodes normal  Neurologic:   CNII-XII intact, normal strength, sensation and reflexes    throughout    LABORATORY DATA:  Results for orders placed or performed in visit on 05/14/22 (from the past 48 hour(s))  CBC with Differential (Cancer Center Only)     Status: None   Collection Time: 05/14/22  2:42 PM  Result Value Ref Range   WBC Count 6.6 4.0 - 10.5 K/uL   RBC 4.19 3.87 - 5.11 MIL/uL   Hemoglobin 12.9 12.0 - 15.0 g/dL   HCT 16.1 09.6 - 04.5 %   MCV 90.7 80.0 - 100.0 fL   MCH 30.8 26.0 - 34.0 pg   MCHC 33.9 30.0 - 36.0 g/dL   RDW 40.9 81.1 - 91.4 %   Platelet Count 245 150 - 400 K/uL   nRBC 0.0 0.0 - 0.2 %   Neutrophils Relative % 55 %   Neutro Abs 3.7 1.7 - 7.7 K/uL   Lymphocytes Relative 31 %   Lymphs Abs 2.1 0.7 - 4.0 K/uL   Monocytes Relative 10 %   Monocytes Absolute 0.7 0.1 - 1.0 K/uL   Eosinophils Relative 3 %   Eosinophils Absolute 0.2 0.0 - 0.5 K/uL   Basophils Relative 1 %   Basophils Absolute 0.1 0.0 - 0.1 K/uL   Immature Granulocytes 0 %   Abs Immature Granulocytes 0.02 0.00 - 0.07 K/uL    Comment: Performed at [facility] at [facility], [address] [place], [place] [address]  CMP (Cancer Center only)      Status: Abnormal   Collection Time: 05/14/22  2:42 PM  Result Value Ref Range   Sodium 141 135 - 145 mmol/L   Potassium 4.1 3.5 - 5.1 mmol/L   Chloride 103 98 - 111 mmol/L   CO2 28 22 - 32 mmol/L   Glucose, Bld 98 70 - 99 mg/dL    Comment: Glucose reference range applies  only to samples taken after fasting for at least 8 hours.   BUN 12 8 - 23 mg/dL   Creatinine 1.61 (H) 0.96 - 1.00 mg/dL   Calcium 9.7 8.9 - 04.5 mg/dL   Total Protein 7.4 6.5 - 8.1 g/dL   Albumin 4.4 3.5 - 5.0 g/dL   AST 16 15 - 41 U/L   ALT 12 0 - 44 U/L   Alkaline Phosphatase 60 38 - 126 U/L   Total Bilirubin 0.8 0.3 - 1.2 mg/dL   GFR, Estimated 60 (L) >60 mL/min    Comment: (NOTE) Calculated using the CKD-EPI Creatinine Equation (2021)    Anion gap 10 5 - 15    Comment: Performed at [facility] at [facility], [address] [place], [place] [address]      RADIOGRAPHY: No results found.     PATHOLOGY: None  ASSESSMENT/PLAN: Ms. Hyppolite is a very pleasant 67 yo caucasian female with recently elevated ferritin.  Iron studies and Hemochromatosis DNA are pending.  We will also get an [place] to better evaluate her liver and spleen.  Follow-up pending results.   All questions were answered. The patient knows to call the clinic with any problems, questions or concerns. We can certainly see the patient much sooner if necessary.  Eileen Stanford, NP

## 2022-05-15 LAB — IRON AND IRON BINDING CAPACITY (CC-WL,HP ONLY)
Iron: 64 ug/dL (ref 28–170)
Saturation Ratios: 26 % (ref 10.4–31.8)
TIBC: 242 ug/dL — ABNORMAL LOW (ref 250–450)
UIBC: 178 ug/dL (ref 148–442)

## 2022-05-16 ENCOUNTER — Ambulatory Visit (HOSPITAL_BASED_OUTPATIENT_CLINIC_OR_DEPARTMENT_OTHER)
Admission: RE | Admit: 2022-05-16 | Discharge: 2022-05-16 | Disposition: A | Discharge status: Home / Self Care | Payer: Medicare Other | Source: Ambulatory Visit | Attending: Family | Admitting: Family

## 2022-05-16 DIAGNOSIS — F1011 Alcohol abuse, in remission: Secondary | ICD-10-CM | POA: Diagnosis present

## 2022-05-16 DIAGNOSIS — R7989 Other specified abnormal findings of blood chemistry: Secondary | ICD-10-CM

## 2022-05-16 DIAGNOSIS — R1011 Right upper quadrant pain: Secondary | ICD-10-CM | POA: Diagnosis present

## 2022-05-19 ENCOUNTER — Encounter: Payer: Self-pay | Admitting: Family

## 2022-05-19 ENCOUNTER — Telehealth (HOSPITAL_BASED_OUTPATIENT_CLINIC_OR_DEPARTMENT_OTHER): Payer: Self-pay

## 2022-05-19 ENCOUNTER — Telehealth: Payer: Self-pay | Admitting: Family

## 2022-05-19 DIAGNOSIS — K824 Cholesterolosis of gallbladder: Secondary | ICD-10-CM

## 2022-05-19 NOTE — Telephone Encounter (Signed)
I was able to speak with the patient and go over recent iron studies. Hemochromatosis DNA still pending. Spoke with Dr. Myna Hidalgo regarding the 8 mm gall bladder polyp noted on [place]. We will repeat US in 6 month. Patient in agreement with the plan and appreciative of call.

## 2022-06-03 ENCOUNTER — Telehealth: Payer: Self-pay | Admitting: Family

## 2022-06-03 ENCOUNTER — Encounter: Payer: Self-pay | Admitting: Family

## 2022-06-03 ENCOUNTER — Other Ambulatory Visit: Payer: Self-pay | Admitting: Family

## 2022-06-03 LAB — HEMOCHROMATOSIS DNA-PCR(C282Y,H63D)

## 2022-06-03 NOTE — Telephone Encounter (Signed)
I left a voicemail and patient's personal phone with call back number to discuss her Hemochromatosis DNA results. 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  Iron saturation was stable at 26% but ferritin was elevated at 677.  We will get her set up for phlebotomy this week and next week. Replacement fluids can be given if needed. She needs to eat well and drink at least 2 bottles of water prior to getting here.  Follow-up with lab recheck in 1 month.

## 2022-06-05 ENCOUNTER — Inpatient Hospital Stay: Payer: Medicare Other | Attending: Hematology & Oncology

## 2022-06-05 ENCOUNTER — Inpatient Hospital Stay: Payer: Medicare Other

## 2022-06-05 MED ORDER — SODIUM CHLORIDE 0.9 % IV SOLN: INTRAVENOUS | Status: DC

## 2022-06-05 NOTE — Patient Instructions (Signed)

## 2022-06-05 NOTE — Progress Notes (Signed)
Jane Obrien presents today for phlebotomy per MD orders. Phlebotomy procedure started at 1111 and ended at 1130. 510 grams removed from rt [facility] using 18g IV catheter.  Patient observed for 30 minutes after procedure without any incident. Patient tolerated procedure well. IV needle removed intact.

## 2022-06-10 ENCOUNTER — Telehealth: Payer: Self-pay | Admitting: Family

## 2022-06-10 NOTE — Telephone Encounter (Signed)
I was able to speak with Jane Obrien and go over recent hemochromatosis DNA and iron results. She has her second of 2 phlebotomies this week on Thursday and then follow-up with lab recheck in 1 month. No questions or concerns at this time. Patient appreciative of call.

## 2022-06-12 ENCOUNTER — Inpatient Hospital Stay: Payer: Medicare Other

## 2022-06-12 MED ORDER — SODIUM CHLORIDE 0.9 % IV SOLN: INTRAVENOUS | Status: DC

## 2022-06-12 NOTE — Progress Notes (Signed)
Jane Obrien presents today for phlebotomy per MD orders. Phlebotomy procedure started at 1215 with 20 g angio and ended at 1235. 524 grams removed. Normal saline hung Wide open. Patient states she is not feeling well.  BP 104/61.   Patient observed for 30 minutes after procedure without any incident. Patient tolerated procedure well. IV needle removed intact.  1342  Patient up to BR, feeling much better, VSS.  Patient assures me that feels ok to drive home

## 2022-06-12 NOTE — Patient Instructions (Signed)

## 2022-07-11 ENCOUNTER — Inpatient Hospital Stay: Payer: Medicare Other | Attending: Hematology & Oncology

## 2022-07-11 ENCOUNTER — Encounter: Payer: Self-pay | Admitting: Family

## 2022-07-11 ENCOUNTER — Inpatient Hospital Stay (HOSPITAL_BASED_OUTPATIENT_CLINIC_OR_DEPARTMENT_OTHER): Payer: Medicare Other | Admitting: Family

## 2022-07-11 LAB — CMP (CANCER CENTER ONLY)
ALT: 8 U/L (ref 0–44)
AST: 16 U/L (ref 15–41)
Albumin: 4.8 g/dL (ref 3.5–5.0)
Alkaline Phosphatase: 50 U/L (ref 38–126)
Anion gap: 10 (ref 5–15)
BUN: 17 mg/dL (ref 8–23)
CO2: 28 mmol/L (ref 22–32)
Calcium: 10.4 mg/dL — ABNORMAL HIGH (ref 8.9–10.3)
Chloride: 102 mmol/L (ref 98–111)
Creatinine: 1.06 mg/dL — ABNORMAL HIGH (ref 0.44–1.00)
GFR, Estimated: 58 mL/min — ABNORMAL LOW (ref >60)
Glucose, Bld: 119 mg/dL — ABNORMAL HIGH (ref 70–99)
Potassium: 4 mmol/L (ref 3.5–5.1)
Sodium: 140 mmol/L (ref 135–145)
Total Bilirubin: 0.9 mg/dL (ref 0.3–1.2)
Total Protein: 7.5 g/dL (ref 6.5–8.1)

## 2022-07-11 LAB — CBC WITH DIFFERENTIAL (CANCER CENTER ONLY)
Abs Immature Granulocytes: 0.02 10*3/uL (ref 0.00–0.07)
Basophils Absolute: 0.1 10*3/uL (ref 0.0–0.1)
Basophils Relative: 1 %
Eosinophils Absolute: 0.2 10*3/uL (ref 0.0–0.5)
Eosinophils Relative: 3 %
HCT: 39.8 % (ref 36.0–46.0)
Hemoglobin: 13.1 g/dL (ref 12.0–15.0)
Immature Granulocytes: 0 %
Lymphocytes Relative: 24 %
Lymphs Abs: 1.5 10*3/uL (ref 0.7–4.0)
MCH: 32.3 pg (ref 26.0–34.0)
MCHC: 32.9 g/dL (ref 30.0–36.0)
MCV: 98 fL (ref 80.0–100.0)
Monocytes Absolute: 0.6 10*3/uL (ref 0.1–1.0)
Monocytes Relative: 9 %
Neutro Abs: 4 10*3/uL (ref 1.7–7.7)
Neutrophils Relative %: 63 %
Platelet Count: 249 10*3/uL (ref 150–400)
RBC: 4.06 MIL/uL (ref 3.87–5.11)
RDW: 14.6 % (ref 11.5–15.5)
WBC Count: 6.3 10*3/uL (ref 4.0–10.5)
nRBC: 0 % (ref 0.0–0.2)

## 2022-07-11 LAB — IRON AND IRON BINDING CAPACITY (CC-WL,HP ONLY)
Iron: 60 ug/dL (ref 28–170)
Saturation Ratios: 18 % (ref 10.4–31.8)
TIBC: 328 ug/dL (ref 250–450)
UIBC: 268 ug/dL (ref 148–442)

## 2022-07-11 LAB — FERRITIN: Ferritin: 477 ng/mL — ABNORMAL HIGH (ref 11–307)

## 2022-07-11 NOTE — Progress Notes (Unsigned)
Hematology and Oncology Follow Up Visit  Jane Obrien [id number] Dec 28, 1955 67 y.o. 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 She is doing well and has no complaints at this time.  She tolerated her phlebotomies nicely. She did have some dizziness after her second phlebotomy that resolved with replacement fluids.  She notes headaches that can last up to 3 days which sound suspicious for migraines. She was encouraged to follow-up with her PCP for further evaluation.  She denies hypertension. BP today is 108/70 and HR 72.  She has occasional palpitations which she states can be more prominent at night.  No fever, chills, n/v, cough, rash, dizziness, SOB, chest pain, abdominal pain or changes in bowel or bladder habits at this time.  Right upper abdominal quadrant pain comes and goes. She states that Jane Obrien is tolerable.  Mirilax daily has helped prevent constipation.  No blood loss. Bruising or petechiae noted.  No swelling, tenderness, numbness or tingling in her extremities.  No falls or syncope.  Appetite and hydration are good. Weight is stable at 154 lbs.   ECOG Performance Status: 1 - Symptomatic but completely ambulatory  Medications:  Allergies as of 07/11/2022       Reactions   Clindamycin/lincomycin    Flagyl [metronidazole]         Medication List        Accurate as of July 11, 2022 11:29 AM. If you have any questions, ask your nurse or doctor.          ALPRAZolam 0.5 MG tablet Commonly known as: XANAX Take 0.5 mg by mouth at bedtime as needed for sleep.   doxycycline 50 MG capsule Commonly known as: VIBRAMYCIN Take 1 capsule (50 mg total) by mouth 2 (two) times daily.   ESTRACE VAGINAL 0.1 MG/GM vaginal cream Generic drug: estradiol Place 1  Applicatorful vaginally daily.   LOSARTAN POTASSIUM-HCTZ PO   polyethylene glycol powder 17 GM/SCOOP powder Commonly known as: GLYCOLAX/MIRALAX Take 1 Container by mouth daily.        Allergies:  Allergies  Allergen Reactions   Clindamycin/Lincomycin    Flagyl [Metronidazole]     Past Medical History, Surgical history, Social history, and Family History were reviewed and updated.  Review of Systems: All other 10 point review of systems is negative.   Physical Exam:  weight is 154 lb 12.8 oz (70.2 kg). Her oral temperature is 98.1 F (36.7 C). Her blood pressure is 108/70 and her pulse is 72. Her respiration is 17 and oxygen saturation is 100%.   Wt Readings from Last 3 Encounters:  07/11/22 154 lb 12.8 oz (70.2 kg)  05/14/22 154 lb 6.4 oz (70 kg)  06/22/12 175 lb (79.4 kg)    Ocular: Sclerae unicteric, pupils equal, round and reactive to light Ear-nose-throat: Oropharynx clear, dentition fair Lymphatic: No cervical or supraclavicular adenopathy Lungs no rales or rhonchi, good excursion bilaterally Heart regular rate and rhythm, no murmur appreciated Abd soft, nontender, positive bowel sounds MSK no focal spinal tenderness, no joint edema Neuro: non-focal, well-oriented, appropriate affect Breasts: Deferred   Lab Results  Component Value Date   WBC 6.3 07/11/2022   HGB 13.1 07/11/2022   HCT 39.8 07/11/2022   MCV 98.0 07/11/2022   PLT 249 07/11/2022   Lab Results  Component Value Date   FERRITIN 677 (H) 05/14/2022   IRON 64 05/14/2022   TIBC 242 (L) 05/14/2022   UIBC 178 05/14/2022   IRONPCTSAT 26 05/14/2022   Lab Results  Component Value Date   RBC 4.06 07/11/2022   No results found for: "KPAFRELGTCHN", "LAMBDASER", "KAPLAMBRATIO" No results found for: "IGGSERUM", "IGA", "IGMSERUM" No results found for: "TOTALPROTELP", "ALBUMINELP", "A1GS", "A2GS", "BETS", "BETA2SER", "GAMS", "MSPIKE", "SPEI"   Chemistry      Component Value Date/Time   NA 140  07/11/2022 1054   K 4.0 07/11/2022 1054   CL 102 07/11/2022 1054   CO2 28 07/11/2022 1054   BUN 17 07/11/2022 1054   CREATININE 1.06 (H) 07/11/2022 1054      Component Value Date/Time   CALCIUM 10.4 (H) 07/11/2022 1054   ALKPHOS 50 07/11/2022 1054   AST 16 07/11/2022 1054   ALT 8 07/11/2022 1054   BILITOT 0.9 07/11/2022 1054       Impression and Plan: Jane Obrien is a very pleasant 67 yo caucasian female with hemochromatosis.  Iron studies ***  Jane Stanford, NP 6/14/202411:29 AM

## 2022-07-14 ENCOUNTER — Encounter: Payer: Self-pay | Admitting: Family

## 2022-08-22 ENCOUNTER — Inpatient Hospital Stay: Payer: Medicare Other | Attending: Hematology & Oncology

## 2022-08-22 LAB — CMP (CANCER CENTER ONLY)
ALT: 11 U/L (ref 0–44)
AST: 20 U/L (ref 15–41)
Albumin: 5 g/dL (ref 3.5–5.0)
Alkaline Phosphatase: 48 U/L (ref 38–126)
Anion gap: 17 — ABNORMAL HIGH (ref 5–15)
BUN: 14 mg/dL (ref 8–23)
CO2: 25 mmol/L (ref 22–32)
Calcium: 10.1 mg/dL (ref 8.9–10.3)
Chloride: 100 mmol/L (ref 98–111)
Creatinine: 0.99 mg/dL (ref 0.44–1.00)
GFR, Estimated: >60 mL/min (ref >60)
Glucose, Bld: 80 mg/dL (ref 70–99)
Potassium: 4.2 mmol/L (ref 3.5–5.1)
Sodium: 142 mmol/L (ref 135–145)
Total Bilirubin: 0.9 mg/dL (ref 0.3–1.2)
Total Protein: 7.6 g/dL (ref 6.5–8.1)

## 2022-08-22 LAB — CBC WITH DIFFERENTIAL (CANCER CENTER ONLY)
Abs Immature Granulocytes: 0.02 10*3/uL (ref 0.00–0.07)
Basophils Absolute: 0.1 10*3/uL (ref 0.0–0.1)
Basophils Relative: 1 %
Eosinophils Absolute: 0.1 10*3/uL (ref 0.0–0.5)
Eosinophils Relative: 2 %
HCT: 41.7 % (ref 36.0–46.0)
Hemoglobin: 13.7 g/dL (ref 12.0–15.0)
Immature Granulocytes: 0 %
Lymphocytes Relative: 21 %
Lymphs Abs: 1.4 10*3/uL (ref 0.7–4.0)
MCH: 31.9 pg (ref 26.0–34.0)
MCHC: 32.9 g/dL (ref 30.0–36.0)
MCV: 97.2 fL (ref 80.0–100.0)
Monocytes Absolute: 0.6 10*3/uL (ref 0.1–1.0)
Monocytes Relative: 9 %
Neutro Abs: 4.6 10*3/uL (ref 1.7–7.7)
Neutrophils Relative %: 67 %
Platelet Count: 220 10*3/uL (ref 150–400)
RBC: 4.29 MIL/uL (ref 3.87–5.11)
RDW: 12.9 % (ref 11.5–15.5)
WBC Count: 6.8 10*3/uL (ref 4.0–10.5)
nRBC: 0 % (ref 0.0–0.2)

## 2022-08-22 LAB — FERRITIN: Ferritin: 592 ng/mL — ABNORMAL HIGH (ref 11–307)

## 2022-08-22 LAB — IRON AND IRON BINDING CAPACITY (CC-WL,HP ONLY)
Iron: 75 ug/dL (ref 28–170)
Saturation Ratios: 25 % (ref 10.4–31.8)
TIBC: 300 ug/dL (ref 250–450)
UIBC: 225 ug/dL (ref 148–442)

## 2022-10-08 ENCOUNTER — Inpatient Hospital Stay (HOSPITAL_BASED_OUTPATIENT_CLINIC_OR_DEPARTMENT_OTHER): Payer: Medicare Other | Admitting: Family

## 2022-10-08 ENCOUNTER — Inpatient Hospital Stay: Payer: Medicare Other | Attending: Hematology & Oncology

## 2022-10-08 ENCOUNTER — Encounter: Payer: Self-pay | Admitting: Family

## 2022-10-08 LAB — CBC WITH DIFFERENTIAL (CANCER CENTER ONLY)
Abs Immature Granulocytes: 0.08 10*3/uL — ABNORMAL HIGH (ref 0.00–0.07)
Basophils Absolute: 0 10*3/uL (ref 0.0–0.1)
Basophils Relative: 1 %
Eosinophils Absolute: 0.1 10*3/uL (ref 0.0–0.5)
Eosinophils Relative: 2 %
HCT: 39 % (ref 36.0–46.0)
Hemoglobin: 13.1 g/dL (ref 12.0–15.0)
Immature Granulocytes: 2 %
Lymphocytes Relative: 32 %
Lymphs Abs: 1.6 10*3/uL (ref 0.7–4.0)
MCH: 30.8 pg (ref 26.0–34.0)
MCHC: 33.6 g/dL (ref 30.0–36.0)
MCV: 91.5 fL (ref 80.0–100.0)
Monocytes Absolute: 0.5 10*3/uL (ref 0.1–1.0)
Monocytes Relative: 9 %
Neutro Abs: 2.7 10*3/uL (ref 1.7–7.7)
Neutrophils Relative %: 54 %
Platelet Count: 211 10*3/uL (ref 150–400)
RBC: 4.26 MIL/uL (ref 3.87–5.11)
RDW: 12.3 % (ref 11.5–15.5)
WBC Count: 5.1 10*3/uL (ref 4.0–10.5)
nRBC: 0 % (ref 0.0–0.2)

## 2022-10-08 LAB — CMP (CANCER CENTER ONLY)
ALT: 15 U/L (ref 0–44)
AST: 19 U/L (ref 15–41)
Albumin: 4.7 g/dL (ref 3.5–5.0)
Alkaline Phosphatase: 49 U/L (ref 38–126)
Anion gap: 10 (ref 5–15)
BUN: 9 mg/dL (ref 8–23)
CO2: 28 mmol/L (ref 22–32)
Calcium: 10.2 mg/dL (ref 8.9–10.3)
Chloride: 106 mmol/L (ref 98–111)
Creatinine: 1.04 mg/dL — ABNORMAL HIGH (ref 0.44–1.00)
GFR, Estimated: 59 mL/min — ABNORMAL LOW (ref >60)
Glucose, Bld: 97 mg/dL (ref 70–99)
Potassium: 5.2 mmol/L — ABNORMAL HIGH (ref 3.5–5.1)
Sodium: 144 mmol/L (ref 135–145)
Total Bilirubin: 1.2 mg/dL (ref 0.3–1.2)
Total Protein: 7.3 g/dL (ref 6.5–8.1)

## 2022-10-08 LAB — IRON AND IRON BINDING CAPACITY (CC-WL,HP ONLY)
Iron: 118 ug/dL (ref 28–170)
Saturation Ratios: 48 % — ABNORMAL HIGH (ref 10.4–31.8)
TIBC: 248 ug/dL — ABNORMAL LOW (ref 250–450)
UIBC: 130 ug/dL — ABNORMAL LOW (ref 148–442)

## 2022-10-08 LAB — FERRITIN: Ferritin: 652 ng/mL — ABNORMAL HIGH (ref 11–307)

## 2022-10-08 NOTE — Progress Notes (Signed)
Hematology and Oncology Follow Up Visit  Jane Obrien [id number] Sep 20, 1955 67 y.o. 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 She is doing fairly well. She has intermittent pain in her hips.  She also has generalized joint aches and pains.  No falls or syncope reported.  She has occasional dizziness.  No fever, chills, n/v, cough, rash, dizziness, SOB, chest pain, palpitations, abdominal pain or changes in bowel or bladder habits at this time.  No swelling in her extremities. No neuropathy at this time.  No falls or syncope reported.  Appetite and hydration are good. Weight is stable at 149 lbs.   ECOG Performance Status: 1 - Symptomatic but completely ambulatory  Medications:  Allergies as of 10/08/2022       Reactions   Clindamycin/lincomycin    Flagyl [metronidazole]         Medication List        Accurate as of October 08, 2022 10:16 AM. If you have any questions, ask your nurse or doctor.          ALPRAZolam 0.5 MG tablet Commonly known as: XANAX Take 0.5 mg by mouth at bedtime as needed for sleep.   doxycycline 50 MG capsule Commonly known as: VIBRAMYCIN Take 1 capsule (50 mg total) by mouth 2 (two) times daily.   ESTRACE VAGINAL 0.1 MG/GM vaginal cream Generic drug: estradiol Place 1 Applicatorful vaginally daily.   LOSARTAN POTASSIUM-HCTZ PO   polyethylene glycol powder 17 GM/SCOOP powder Commonly known as: GLYCOLAX/MIRALAX Take 1 Container by mouth daily.        Allergies:  Allergies  Allergen Reactions   Clindamycin/Lincomycin    Flagyl [Metronidazole]     Past Medical History, Surgical history, Social history, and Family History were reviewed and updated.  Review of Systems: All other 10 point review of systems is  negative.   Physical Exam:  weight is 149 lb 6.4 oz (67.8 kg). Her oral temperature is 98.1 F (36.7 C). Her blood pressure is 104/74 and her pulse is 78. Her respiration is 17 and oxygen saturation is 98%.   Wt Readings from Last 3 Encounters:  10/08/22 149 lb 6.4 oz (67.8 kg)  07/11/22 154 lb 12.8 oz (70.2 kg)  05/14/22 154 lb 6.4 oz (70 kg)    Ocular: Sclerae unicteric, pupils equal, round and reactive to light Ear-nose-throat: Oropharynx clear, dentition fair Lymphatic: No cervical or supraclavicular adenopathy Lungs no rales or rhonchi, good excursion bilaterally Heart regular rate and rhythm, no murmur appreciated Abd soft, nontender, positive bowel sounds MSK no focal spinal tenderness, no joint edema Neuro: non-focal, well-oriented, appropriate affect Breasts: Deferred   Lab Results  Component Value Date   WBC 5.1 10/08/2022   HGB 13.1 10/08/2022   HCT 39.0 10/08/2022   MCV 91.5 10/08/2022   PLT 211 10/08/2022   Lab Results  Component Value Date   FERRITIN 592 (H) 08/22/2022   IRON 75 08/22/2022   TIBC 300 08/22/2022   UIBC 225 08/22/2022   IRONPCTSAT 25 08/22/2022   Lab Results  Component Value Date   RBC 4.26 10/08/2022   No results found for: "KPAFRELGTCHN", "LAMBDASER", "KAPLAMBRATIO" No results found for: "IGGSERUM", "IGA", "IGMSERUM" No results found for: "TOTALPROTELP", "ALBUMINELP", "A1GS", "A2GS", "BETS", "BETA2SER", "GAMS", "MSPIKE", "  SPEI"   Chemistry      Component Value Date/Time   NA 144 10/08/2022 0935   K 5.2 (H) 10/08/2022 0935   CL 106 10/08/2022 0935   CO2 28 10/08/2022 0935   BUN 9 10/08/2022 0935   CREATININE 1.04 (H) 10/08/2022 0935      Component Value Date/Time   CALCIUM 10.2 10/08/2022 0935   ALKPHOS 49 10/08/2022 0935   AST 19 10/08/2022 0935   ALT 15 10/08/2022 0935   BILITOT 1.2 10/08/2022 0935       Impression and Plan: Jane Obrien is a very pleasant 67 yo caucasian female with hemochromatosis.  Iron studies  pending. We will set her up for phlebotomy if needed.  Follow-up in 3 months.   Eileen Stanford, NP 9/11/202410:16 AM

## 2022-10-10 ENCOUNTER — Other Ambulatory Visit: Payer: Medicare Other

## 2022-10-10 ENCOUNTER — Ambulatory Visit: Payer: Medicare Other | Admitting: Family

## 2022-10-13 ENCOUNTER — Inpatient Hospital Stay: Payer: Medicare Other

## 2022-10-13 MED ORDER — SODIUM CHLORIDE 0.9 % IV SOLN: INTRAVENOUS | Status: DC

## 2022-10-13 NOTE — Progress Notes (Signed)
Jane Obrien presents today for phlebotomy per MD orders. Phlebotomy procedure started at 1144 and ended at 1210. 526 cc removed via 20 G needle at [facility] site. Patient tolerated procedure well. IV replacement fluids given post phlebotomy. IV needle removed intact.

## 2022-10-13 NOTE — Patient Instructions (Signed)

## 2022-10-27 ENCOUNTER — Inpatient Hospital Stay: Payer: Medicare Other

## 2022-10-27 MED ORDER — SODIUM CHLORIDE 0.9 % IV SOLN: INTRAVENOUS | Status: DC

## 2022-10-27 NOTE — Patient Instructions (Signed)

## 2022-10-27 NOTE — Progress Notes (Signed)
Jane Obrien presents today for phlebotomy per MD orders. Phlebotomy procedure started at 115 and ended at 1128. 535 grams removed via 20 gauge needle to right AC. Patient observed for 30 minutes after procedure without any incident while receiving replacement fluids. Pt given snacks during replacement fluids. Patient tolerated procedure well. IV needle removed intact.

## 2022-11-03 ENCOUNTER — Ambulatory Visit (HOSPITAL_BASED_OUTPATIENT_CLINIC_OR_DEPARTMENT_OTHER): Payer: Medicare Other

## 2022-11-07 ENCOUNTER — Other Ambulatory Visit: Payer: Medicare Other

## 2022-11-07 ENCOUNTER — Other Ambulatory Visit: Payer: Self-pay | Admitting: *Deleted

## 2022-11-07 ENCOUNTER — Ambulatory Visit: Payer: Medicare Other | Admitting: Family

## 2022-11-07 DIAGNOSIS — R7989 Other specified abnormal findings of blood chemistry: Secondary | ICD-10-CM

## 2022-11-10 ENCOUNTER — Inpatient Hospital Stay: Payer: Medicare Other | Attending: Hematology & Oncology

## 2022-11-10 ENCOUNTER — Other Ambulatory Visit: Payer: Self-pay

## 2022-11-10 ENCOUNTER — Encounter: Payer: Self-pay | Admitting: Medical Oncology

## 2022-11-10 ENCOUNTER — Inpatient Hospital Stay (HOSPITAL_BASED_OUTPATIENT_CLINIC_OR_DEPARTMENT_OTHER): Payer: Medicare Other | Admitting: Medical Oncology

## 2022-11-10 DIAGNOSIS — R7989 Other specified abnormal findings of blood chemistry: Secondary | ICD-10-CM

## 2022-11-10 DIAGNOSIS — F10939 Alcohol use, unspecified with withdrawal, unspecified: Secondary | ICD-10-CM | POA: Insufficient documentation

## 2022-11-10 DIAGNOSIS — F1011 Alcohol abuse, in remission: Secondary | ICD-10-CM

## 2022-11-10 LAB — CBC WITH DIFFERENTIAL (CANCER CENTER ONLY)
Abs Immature Granulocytes: 0.01 10*3/uL (ref 0.00–0.07)
Basophils Absolute: 0 10*3/uL (ref 0.0–0.1)
Basophils Relative: 1 %
Eosinophils Absolute: 0.1 10*3/uL (ref 0.0–0.5)
Eosinophils Relative: 2 %
HCT: 34.9 % — ABNORMAL LOW (ref 36.0–46.0)
Hemoglobin: 11.5 g/dL — ABNORMAL LOW (ref 12.0–15.0)
Immature Granulocytes: 0 %
Lymphocytes Relative: 30 %
Lymphs Abs: 1.3 10*3/uL (ref 0.7–4.0)
MCH: 32.6 pg (ref 26.0–34.0)
MCHC: 33 g/dL (ref 30.0–36.0)
MCV: 98.9 fL (ref 80.0–100.0)
Monocytes Absolute: 0.4 10*3/uL (ref 0.1–1.0)
Monocytes Relative: 10 %
Neutro Abs: 2.5 10*3/uL (ref 1.7–7.7)
Neutrophils Relative %: 57 %
Platelet Count: 201 10*3/uL (ref 150–400)
RBC: 3.53 MIL/uL — ABNORMAL LOW (ref 3.87–5.11)
RDW: 16 % — ABNORMAL HIGH (ref 11.5–15.5)
WBC Count: 4.4 10*3/uL (ref 4.0–10.5)
nRBC: 0 % (ref 0.0–0.2)

## 2022-11-10 LAB — CMP (CANCER CENTER ONLY)
ALT: 6 U/L (ref 0–44)
AST: 14 U/L — ABNORMAL LOW (ref 15–41)
Albumin: 4.3 g/dL (ref 3.5–5.0)
Alkaline Phosphatase: 51 U/L (ref 38–126)
Anion gap: 11 (ref 5–15)
BUN: 12 mg/dL (ref 8–23)
CO2: 27 mmol/L (ref 22–32)
Calcium: 9.6 mg/dL (ref 8.9–10.3)
Chloride: 106 mmol/L (ref 98–111)
Creatinine: 0.99 mg/dL (ref 0.44–1.00)
GFR, Estimated: >60 mL/min (ref >60)
Glucose, Bld: 99 mg/dL (ref 70–99)
Potassium: 5 mmol/L (ref 3.5–5.1)
Sodium: 144 mmol/L (ref 135–145)
Total Bilirubin: 1 mg/dL (ref 0.3–1.2)
Total Protein: 7.2 g/dL (ref 6.5–8.1)

## 2022-11-10 LAB — IRON AND IRON BINDING CAPACITY (CC-WL,HP ONLY)
Iron: 63 ug/dL (ref 28–170)
Saturation Ratios: 19 % (ref 10.4–31.8)
TIBC: 329 ug/dL (ref 250–450)
UIBC: 266 ug/dL (ref 148–442)

## 2022-11-10 LAB — FERRITIN: Ferritin: 283 ng/mL (ref 11–307)

## 2022-11-10 NOTE — Progress Notes (Signed)
Hematology and Oncology Follow Up Visit  Jane Obrien [id number] [birth year] 67 y.o. 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   She has not noticed any difference in symptoms since her two phlebotomies.   Recent iron studied from 10/08/2022 show an iron saturation of 48%, and ferritin of 652. Since these labs she had a phlebotomy on 10/13/2022 and 10/27/2022.   ETOH use is heavy at the moment. She has tried weaning down on her use without improvement in ferritin levels in the past per patient. She is going to try to wean down on her ETOH use again soon.   Arthritis is stable.  No falls or syncope reported.  No fever, chills, n/v, cough, rash, dizziness, SOB, chest pain, palpitations, abdominal pain or changes in bowel or bladder habits at this time.  No swelling in her extremities. No neuropathy at this time.  No falls or syncope reported.  Appetite and hydration are good.   ECOG Performance Status: 1 - Symptomatic but completely ambulatory  Medications:  Allergies as of 11/10/2022       Reactions   Clindamycin/lincomycin Hives, Other (See Comments)   Blisters/open sores all over upper trunk, face and neck   Flagyl [metronidazole] Nausea Only, Other (See Comments)   Headaches, fatigue, felt bad when taking PILL form of Flagyl        Medication List        Accurate as of November 10, 2022 10:47 AM. If you have any questions, ask your nurse or doctor.          STOP taking these medications    doxycycline 50 MG capsule Commonly known as: VIBRAMYCIN Stopped by: [name]       TAKE these medications    ALPRAZolam 0.5 MG tablet Commonly known as: XANAX Take 0.5 mg by mouth at bedtime as needed for sleep.   ESTRACE VAGINAL 0.1 MG/GM  vaginal cream Generic drug: estradiol Place 1 Applicatorful vaginally daily.   LOSARTAN POTASSIUM-HCTZ PO   metroNIDAZOLE 0.75 % cream Commonly known as: METROCREAM Apply 1 Application topically 2 (two) times daily.   polyethylene glycol powder 17 GM/SCOOP powder Commonly known as: GLYCOLAX/MIRALAX Take 1 Container by mouth daily.        Allergies:  Allergies  Allergen Reactions   Clindamycin/Lincomycin Hives and Other (See Comments)    Blisters/open sores all over upper trunk, face and neck   Flagyl [Metronidazole] Nausea Only and Other (See Comments)    Headaches, fatigue, felt bad when taking PILL form of Flagyl    Past Medical History, Surgical history, Social history, and Family History were reviewed and updated.  Review of Systems: All other 10 point review of systems is negative.   Physical Exam:  height is 5\' 2"  (1.575 m) and weight is 150 lb 0.6 oz (68.1 kg). Her oral temperature is 98.1 F (36.7 C). Her blood pressure is 109/70 and her pulse is 74. Her respiration is 18 and oxygen saturation is 100%.   Wt Readings from Last 3 Encounters:  11/10/22 150 lb 0.6 oz (68.1 kg)  10/08/22 149 lb 6.4 oz (67.8 kg)  07/11/22 154 lb 12.8 oz (70.2 kg)    Ocular: Sclerae unicteric, pupils equal, round and reactive to light Ear-nose-throat:  Oropharynx clear, dentition fair Lymphatic: No cervical or supraclavicular adenopathy Lungs no rales or rhonchi, good excursion bilaterally Heart regular rate and rhythm, no murmur appreciated Abd soft, nontender, positive bowel sounds MSK no focal spinal tenderness, no joint edema Neuro: non-focal, well-oriented, appropriate affect   Lab Results  Component Value Date   WBC 4.4 11/10/2022   HGB 11.5 (L) 11/10/2022   HCT 34.9 (L) 11/10/2022   MCV 98.9 11/10/2022   PLT 201 11/10/2022   Lab Results  Component Value Date   FERRITIN 652 (H) 10/08/2022   IRON 118 10/08/2022   TIBC 248 (L) 10/08/2022   UIBC 130 (L) 10/08/2022    IRONPCTSAT 48 (H) 10/08/2022   Lab Results  Component Value Date   RBC 3.53 (L) 11/10/2022   No results found for: "KPAFRELGTCHN", "LAMBDASER", "KAPLAMBRATIO" No results found for: "IGGSERUM", "IGA", "IGMSERUM" No results found for: "TOTALPROTELP", "ALBUMINELP", "A1GS", "A2GS", "BETS", "BETA2SER", "GAMS", "MSPIKE", "SPEI"   Chemistry      Component Value Date/Time   NA 144 11/10/2022 0959   K 5.0 11/10/2022 0959   CL 106 11/10/2022 0959   CO2 27 11/10/2022 0959   BUN 12 11/10/2022 0959   CREATININE 0.99 11/10/2022 0959      Component Value Date/Time   CALCIUM 9.6 11/10/2022 0959   ALKPHOS 51 11/10/2022 0959   AST 14 (L) 11/10/2022 0959   ALT 6 11/10/2022 0959   BILITOT 1.0 11/10/2022 0959      Encounter Diagnoses  Name Primary?   Hereditary hemochromatosis (HCC) Yes   History of ETOH abuse    Elevated ferritin    Iron overload     Impression and Plan: Jane Obrien is a very pleasant 67 yo caucasian female with hemochromatosis. Phlebotomy to maintain iron saturation < 50% and ferritin < 100.   Iron studies pending. Hgb is 11.5 today. Will likely hold off on phlebotomy at this time until Hgb recovers.  Discussed slowly weaning off of her ETOH use Discussed low iron diet RTC 1 month APP, labs  [name], [place] 10/14/202410:47 AM

## 2022-12-01 ENCOUNTER — Other Ambulatory Visit: Payer: Self-pay | Admitting: Medical Oncology

## 2022-12-01 ENCOUNTER — Ambulatory Visit (HOSPITAL_BASED_OUTPATIENT_CLINIC_OR_DEPARTMENT_OTHER)
Admission: RE | Admit: 2022-12-01 | Discharge: 2022-12-01 | Disposition: A | Discharge status: Home / Self Care | Payer: Medicare Other | Source: Ambulatory Visit | Attending: Family | Admitting: Family

## 2022-12-01 DIAGNOSIS — K824 Cholesterolosis of gallbladder: Secondary | ICD-10-CM | POA: Diagnosis present

## 2023-01-06 ENCOUNTER — Encounter: Payer: Self-pay | Admitting: Medical Oncology

## 2023-01-06 ENCOUNTER — Inpatient Hospital Stay (HOSPITAL_BASED_OUTPATIENT_CLINIC_OR_DEPARTMENT_OTHER): Payer: Medicare Other | Admitting: Medical Oncology

## 2023-01-06 ENCOUNTER — Inpatient Hospital Stay: Payer: Medicare Other | Attending: Hematology & Oncology

## 2023-01-06 DIAGNOSIS — R7989 Other specified abnormal findings of blood chemistry: Secondary | ICD-10-CM

## 2023-01-06 DIAGNOSIS — F1011 Alcohol abuse, in remission: Secondary | ICD-10-CM | POA: Diagnosis not present

## 2023-01-06 DIAGNOSIS — K824 Cholesterolosis of gallbladder: Secondary | ICD-10-CM | POA: Diagnosis not present

## 2023-01-06 LAB — CMP (CANCER CENTER ONLY)
ALT: 11 U/L (ref 0–44)
AST: 20 U/L (ref 15–41)
Albumin: 4.7 g/dL (ref 3.5–5.0)
Alkaline Phosphatase: 53 U/L (ref 38–126)
Anion gap: 11 (ref 5–15)
BUN: 9 mg/dL (ref 8–23)
CO2: 28 mmol/L (ref 22–32)
Calcium: 9.8 mg/dL (ref 8.9–10.3)
Chloride: 102 mmol/L (ref 98–111)
Creatinine: 1.04 mg/dL — ABNORMAL HIGH (ref 0.44–1.00)
GFR, Estimated: 59 mL/min — ABNORMAL LOW (ref >60)
Glucose, Bld: 94 mg/dL (ref 70–99)
Potassium: 3.6 mmol/L (ref 3.5–5.1)
Sodium: 141 mmol/L (ref 135–145)
Total Bilirubin: 0.8 mg/dL (ref <1.2)
Total Protein: 7.7 g/dL (ref 6.5–8.1)

## 2023-01-06 LAB — CBC
HCT: 42.3 % (ref 36.0–46.0)
Hemoglobin: 14.3 g/dL (ref 12.0–15.0)
MCH: 32 pg (ref 26.0–34.0)
MCHC: 33.8 g/dL (ref 30.0–36.0)
MCV: 94.6 fL (ref 80.0–100.0)
Platelets: 194 10*3/uL (ref 150–400)
RBC: 4.47 MIL/uL (ref 3.87–5.11)
RDW: 13.4 % (ref 11.5–15.5)
WBC: 5.1 10*3/uL (ref 4.0–10.5)
nRBC: 0 % (ref 0.0–0.2)

## 2023-01-06 LAB — IRON AND IRON BINDING CAPACITY (CC-WL,HP ONLY)
Iron: 66 ug/dL (ref 28–170)
Saturation Ratios: 20 % (ref 10.4–31.8)
TIBC: 339 ug/dL (ref 250–450)
UIBC: 273 ug/dL (ref 148–442)

## 2023-01-06 LAB — FERRITIN: Ferritin: 301 ng/mL (ref 11–307)

## 2023-01-06 NOTE — Progress Notes (Signed)
Hematology and Oncology Follow Up Visit  Jane Obrien [id number] 1955/04/23 67 y.o. 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    Recent iron studied from 11/10/2022 show an iron saturation of 19%, and ferritin of 283. Since these labs she had a phlebotomy on 10/13/2022 and 10/27/2022.   She has an appointment on May 5th for reapet [place] of her gallbladder. Her previous US showed gallbladder polyps.   She has cut back since her last visit. She reports that in the past reduced ETOH use did not equate to lower ferritin levels.   Arthritis is stable.  Chronic fatigue, has chronic headaches.  She is due for an eye exam No falls or syncope reported.  No fever, chills, n/v, cough, rash, dizziness, SOB, chest pain, palpitations, abdominal pain or changes in bowel or bladder habits at this time.  No swelling in her extremities. No neuropathy at this time.  No falls or syncope reported.  Appetite and hydration are good.   ECOG Performance Status: 1 - Symptomatic but completely ambulatory  Medications:  Allergies as of 01/06/2023       Reactions   Clindamycin/lincomycin Hives, Other (See Comments)   Blisters/open sores all over upper trunk, face and neck   Flagyl [metronidazole] Nausea Only, Other (See Comments)   Headaches, fatigue, felt bad when taking PILL form of Flagyl        Medication List        Accurate as of January 06, 2023  9:29 AM. If you have any questions, ask your nurse or doctor.          ALPRAZolam 0.5 MG tablet Commonly known as: XANAX Take 0.5 mg by mouth at bedtime as needed for sleep.   ESTRACE VAGINAL 0.1 MG/GM vaginal cream Generic drug: estradiol Place 1 Applicatorful vaginally daily.   LOSARTAN POTASSIUM-HCTZ PO    metroNIDAZOLE 0.75 % cream Commonly known as: METROCREAM Apply 1 Application topically 2 (two) times daily.   polyethylene glycol powder 17 GM/SCOOP powder Commonly known as: GLYCOLAX/MIRALAX Take 1 Container by mouth daily.        Allergies:  Allergies  Allergen Reactions   Clindamycin/Lincomycin Hives and Other (See Comments)    Blisters/open sores all over upper trunk, face and neck   Flagyl [Metronidazole] Nausea Only and Other (See Comments)    Headaches, fatigue, felt bad when taking PILL form of Flagyl    Past Medical History, Surgical history, Social history, and Family History were reviewed and updated.  Review of Systems: All other 10 point review of systems is negative.   Physical Exam:  height is 5\' 2"  (1.575 m) and weight is 150 lb (68 kg). Her oral temperature is 98.2 F (36.8 C). Her blood pressure is 106/67 and her pulse is 71. Her respiration is 18 and oxygen saturation is 100%.   Wt Readings from Last 3 Encounters:  01/06/23 150 lb (68 kg)  11/10/22 150 lb 0.6 oz (68.1 kg)  10/08/22 149 lb 6.4 oz (67.8 kg)    Ocular: Sclerae unicteric, pupils equal, round and reactive to light Ear-nose-throat: Oropharynx clear, dentition fair Lymphatic: No cervical or supraclavicular adenopathy Lungs no rales or rhonchi, good excursion bilaterally Heart regular rate and rhythm, no murmur appreciated Abd soft, nontender,  positive bowel sounds MSK no focal spinal tenderness, no joint edema Neuro: non-focal, well-oriented, appropriate affect   Lab Results  Component Value Date   WBC 5.1 01/06/2023   HGB 14.3 01/06/2023   HCT 42.3 01/06/2023   MCV 94.6 01/06/2023   PLT 194 01/06/2023   Lab Results  Component Value Date   FERRITIN 283 11/10/2022   IRON 63 11/10/2022   TIBC 329 11/10/2022   UIBC 266 11/10/2022   IRONPCTSAT 19 11/10/2022   Lab Results  Component Value Date   RBC 4.47 01/06/2023   No results found for: "KPAFRELGTCHN", "LAMBDASER",  "KAPLAMBRATIO" No results found for: "IGGSERUM", "IGA", "IGMSERUM" No results found for: "TOTALPROTELP", "ALBUMINELP", "A1GS", "A2GS", "BETS", "BETA2SER", "GAMS", "MSPIKE", "SPEI"   Chemistry      Component Value Date/Time   NA 141 01/06/2023 0834   K 3.6 01/06/2023 0834   CL 102 01/06/2023 0834   CO2 28 01/06/2023 0834   BUN 9 01/06/2023 0834   CREATININE 1.04 (H) 01/06/2023 0834      Component Value Date/Time   CALCIUM 9.8 01/06/2023 0834   ALKPHOS 53 01/06/2023 0834   AST 20 01/06/2023 0834   ALT 11 01/06/2023 0834   BILITOT 0.8 01/06/2023 0834      Encounter Diagnoses  Name Primary?   Iron overload Yes   Hereditary hemochromatosis (HCC)    Gallbladder polyp    History of ETOH abuse     Impression and Plan: Jane Obrien is a very pleasant 67 yo caucasian female with hemochromatosis. Phlebotomy to maintain iron saturation < 50% and ferritin < 100.   CBC today is normal  Iron studies pending. Will phlebotomize if needed -  She uses replacement fluids.  Continue weaning off ETOH.  Discussed low iron diet RTC 3 month APP, labs (CBC, CMP, iron, ferritin)  [name], PA-C 12/10/20249:29 AM

## 2023-01-08 ENCOUNTER — Other Ambulatory Visit: Payer: Self-pay | Admitting: Medical Oncology

## 2023-01-16 ENCOUNTER — Inpatient Hospital Stay: Payer: Medicare Other

## 2023-01-16 MED ORDER — SODIUM CHLORIDE 0.9 % IV SOLN
INTRAVENOUS | Status: DC
Start: 2023-01-16 — End: 2023-01-16

## 2023-03-05 LAB — HM PAP SMEAR

## 2023-03-05 LAB — HM MAMMOGRAPHY

## 2023-04-07 ENCOUNTER — Inpatient Hospital Stay: Payer: Medicare Other

## 2023-04-07 ENCOUNTER — Ambulatory Visit: Payer: Medicare Other | Admitting: Medical Oncology

## 2023-04-17 ENCOUNTER — Inpatient Hospital Stay (HOSPITAL_BASED_OUTPATIENT_CLINIC_OR_DEPARTMENT_OTHER): Admitting: Family

## 2023-04-17 ENCOUNTER — Inpatient Hospital Stay: Attending: Hematology & Oncology

## 2023-04-17 ENCOUNTER — Encounter: Payer: Self-pay | Admitting: Family

## 2023-04-17 LAB — CMP (CANCER CENTER ONLY)
ALT: 10 U/L (ref 0–44)
AST: 18 U/L (ref 15–41)
Albumin: 5.1 g/dL — ABNORMAL HIGH (ref 3.5–5.0)
Alkaline Phosphatase: 45 U/L (ref 38–126)
Anion gap: 13 (ref 5–15)
BUN: 11 mg/dL (ref 8–23)
CO2: 28 mmol/L (ref 22–32)
Calcium: 10 mg/dL (ref 8.9–10.3)
Chloride: 104 mmol/L (ref 98–111)
Creatinine: 1.04 mg/dL — ABNORMAL HIGH (ref 0.44–1.00)
GFR, Estimated: 59 mL/min — ABNORMAL LOW (ref >60)
Glucose, Bld: 104 mg/dL — ABNORMAL HIGH (ref 70–99)
Potassium: 4.5 mmol/L (ref 3.5–5.1)
Sodium: 145 mmol/L (ref 135–145)
Total Bilirubin: 0.9 mg/dL (ref 0.0–1.2)
Total Protein: 7.7 g/dL (ref 6.5–8.1)

## 2023-04-17 LAB — CBC WITH DIFFERENTIAL (CANCER CENTER ONLY)
Abs Immature Granulocytes: 0.02 10*3/uL (ref 0.00–0.07)
Basophils Absolute: 0.1 10*3/uL (ref 0.0–0.1)
Basophils Relative: 1 %
Eosinophils Absolute: 0.1 10*3/uL (ref 0.0–0.5)
Eosinophils Relative: 2 %
HCT: 42.6 % (ref 36.0–46.0)
Hemoglobin: 14.3 g/dL (ref 12.0–15.0)
Immature Granulocytes: 0 %
Lymphocytes Relative: 28 %
Lymphs Abs: 1.7 10*3/uL (ref 0.7–4.0)
MCH: 31.7 pg (ref 26.0–34.0)
MCHC: 33.6 g/dL (ref 30.0–36.0)
MCV: 94.5 fL (ref 80.0–100.0)
Monocytes Absolute: 0.6 10*3/uL (ref 0.1–1.0)
Monocytes Relative: 9 %
Neutro Abs: 3.7 10*3/uL (ref 1.7–7.7)
Neutrophils Relative %: 60 %
Platelet Count: 221 10*3/uL (ref 150–400)
RBC: 4.51 MIL/uL (ref 3.87–5.11)
RDW: 12.8 % (ref 11.5–15.5)
WBC Count: 6.1 10*3/uL (ref 4.0–10.5)
nRBC: 0 % (ref 0.0–0.2)

## 2023-04-17 LAB — IRON AND IRON BINDING CAPACITY (CC-WL,HP ONLY)
Iron: 96 ug/dL (ref 28–170)
Saturation Ratios: 30 % (ref 10.4–31.8)
TIBC: 322 ug/dL (ref 250–450)
UIBC: 226 ug/dL (ref 148–442)

## 2023-04-17 LAB — FERRITIN: Ferritin: 273 ng/mL (ref 11–307)

## 2023-04-17 NOTE — Progress Notes (Signed)
 Hematology and Oncology Follow Up Visit  Jane Obrien [id number] 10-22-1955 68 y.o. 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 She is doing well but does have twinges of pain in her hips and left upper quadrant off and on, unchanged from baseline.  No fever, chills, n/v, cough, rash, dizziness, SOB, chest pain, palpitations, abdominal pain or changes in bowel or bladder habits at this time.  She takes Miralax to prevent constipation.  No blood loss, abnormal bruising or petechiae.  No swelling or tingling in her extremities.  No falls or syncope reported.  Appetite and hydration are good. Weight is stable at 151 lbs.   ECOG Performance Status: 0 - Asymptomatic  Medications:  Allergies as of 04/17/2023       Reactions   Clindamycin/lincomycin Hives, Other (See Comments)   Blisters/open sores all over upper trunk, face and neck   Flagyl [metronidazole] Nausea Only, Other (See Comments)   Headaches, fatigue, felt bad when taking PILL form of Flagyl        Medication List        Accurate as of April 17, 2023 11:26 AM. If you have any questions, ask your nurse or doctor.          ALPRAZolam 0.5 MG tablet Commonly known as: XANAX Take 0.5 mg by mouth at bedtime as needed for sleep.   ESTRACE VAGINAL 0.1 MG/GM vaginal cream Generic drug: estradiol Place 1 Applicatorful vaginally daily.   LOSARTAN POTASSIUM-HCTZ PO   metroNIDAZOLE 0.75 % cream Commonly known as: METROCREAM Apply 1 Application topically 2 (two) times daily.   minoxidil 2 % external solution Commonly known as: ROGAINE Apply topically 2 (two) times daily.   polyethylene glycol powder 17 GM/SCOOP powder Commonly known as: GLYCOLAX/MIRALAX Take 1 Container by mouth daily.         Allergies:  Allergies  Allergen Reactions   Clindamycin/Lincomycin Hives and Other (See Comments)    Blisters/open sores all over upper trunk, face and neck   Flagyl [Metronidazole] Nausea Only and Other (See Comments)    Headaches, fatigue, felt bad when taking PILL form of Flagyl    Past Medical History, Surgical history, Social history, and Family History were reviewed and updated.  Review of Systems: All other 10 point review of systems is negative.   Physical Exam:  weight is 151 lb 1.3 oz (68.5 kg). Her oral temperature is 97.9 F (36.6 C). Her blood pressure is 108/70 and her pulse is 85. Her respiration is 18 and oxygen saturation is 98%.   Wt Readings from Last 3 Encounters:  04/17/23 151 lb 1.3 oz (68.5 kg)  01/06/23 150 lb (68 kg)  11/10/22 150 lb 0.6 oz (68.1 kg)    Ocular: Sclerae unicteric, pupils equal, round and reactive to light Ear-nose-throat: Oropharynx clear, dentition fair Lymphatic: No cervical or supraclavicular adenopathy Lungs no rales or rhonchi, good excursion bilaterally Heart regular rate and rhythm, no murmur appreciated Abd soft, nontender, positive bowel sounds MSK no focal spinal tenderness, no joint edema Neuro: non-focal, well-oriented, appropriate affect Breasts: Deferred   Lab Results  Component Value Date   WBC 6.1 04/17/2023   HGB 14.3 04/17/2023   HCT 42.6 04/17/2023   MCV 94.5 04/17/2023   PLT 221 04/17/2023  Lab Results  Component Value Date   FERRITIN 301 01/06/2023   IRON 66 01/06/2023   TIBC 339 01/06/2023   UIBC 273 01/06/2023   IRONPCTSAT 20 01/06/2023   Lab Results  Component Value Date   RBC 4.51 04/17/2023   No results found for: "KPAFRELGTCHN", "LAMBDASER", "KAPLAMBRATIO" No results found for: "IGGSERUM", "IGA", "IGMSERUM" No results found for: "TOTALPROTELP", "ALBUMINELP", "A1GS", "A2GS", "BETS", "BETA2SER", "GAMS", "MSPIKE", "SPEI"   Chemistry      Component Value Date/Time   NA 145 04/17/2023  1011   K 4.5 04/17/2023 1011   CL 104 04/17/2023 1011   CO2 28 04/17/2023 1011   BUN 11 04/17/2023 1011   CREATININE 1.04 (H) 04/17/2023 1011      Component Value Date/Time   CALCIUM 10.0 04/17/2023 1011   ALKPHOS 45 04/17/2023 1011   AST 18 04/17/2023 1011   ALT 10 04/17/2023 1011   BILITOT 0.9 04/17/2023 1011       Impression and Plan: Jane Obrien is a very pleasant 68 yo caucasian female with hemochromatosis.  Iron studies are pending. We will schedule phlebotomy if needed.  CBC with diff stable at this time.  Follow-up in 3 months.   Eileen Stanford, NP 3/21/202511:26 AM

## 2023-04-21 ENCOUNTER — Encounter: Payer: Self-pay | Admitting: Family

## 2023-04-27 ENCOUNTER — Inpatient Hospital Stay

## 2023-04-27 MED ORDER — SODIUM CHLORIDE 0.9 % IV SOLN: INTRAVENOUS | Status: DC

## 2023-04-27 NOTE — Patient Instructions (Signed)

## 2023-04-27 NOTE — Progress Notes (Signed)
 Jane Obrien presents today for phlebotomy per MD orders. Phlebotomy procedure started at 1040 and ended at 1052 500 grams removed via 20 gauge needle to left AC.  Patient observed for 30 minutes after procedure without any incident. Patient tolerated procedure well and received replacement fluids after procedure.  Patient understands to call if he has any questions or concerns post discharge.

## 2023-06-01 ENCOUNTER — Ambulatory Visit (HOSPITAL_BASED_OUTPATIENT_CLINIC_OR_DEPARTMENT_OTHER): Payer: Medicare Other

## 2023-06-23 ENCOUNTER — Ambulatory Visit (HOSPITAL_BASED_OUTPATIENT_CLINIC_OR_DEPARTMENT_OTHER)
Admission: RE | Admit: 2023-06-23 | Discharge: 2023-06-23 | Disposition: A | Discharge status: Home / Self Care | Source: Ambulatory Visit | Attending: Medical Oncology | Admitting: Medical Oncology

## 2023-06-23 ENCOUNTER — Ambulatory Visit: Payer: Self-pay | Admitting: Medical Oncology

## 2023-06-23 DIAGNOSIS — K824 Cholesterolosis of gallbladder: Secondary | ICD-10-CM | POA: Diagnosis present

## 2023-07-17 ENCOUNTER — Ambulatory Visit: Admitting: Family

## 2023-07-17 ENCOUNTER — Inpatient Hospital Stay

## 2023-07-29 ENCOUNTER — Ambulatory Visit: Admitting: Family

## 2023-07-29 ENCOUNTER — Inpatient Hospital Stay

## 2023-08-10 ENCOUNTER — Inpatient Hospital Stay: Attending: Hematology & Oncology

## 2023-08-10 ENCOUNTER — Inpatient Hospital Stay (HOSPITAL_BASED_OUTPATIENT_CLINIC_OR_DEPARTMENT_OTHER): Admitting: Family

## 2023-08-10 LAB — CBC WITH DIFFERENTIAL (CANCER CENTER ONLY)
Abs Immature Granulocytes: 0.02 K/uL (ref 0.00–0.07)
Basophils Absolute: 0.1 K/uL (ref 0.0–0.1)
Basophils Relative: 1 %
Eosinophils Absolute: 0.2 K/uL (ref 0.0–0.5)
Eosinophils Relative: 3 %
HCT: 42.3 % (ref 36.0–46.0)
Hemoglobin: 14.5 g/dL (ref 12.0–15.0)
Immature Granulocytes: 0 %
Lymphocytes Relative: 31 %
Lymphs Abs: 2 K/uL (ref 0.7–4.0)
MCH: 31.5 pg (ref 26.0–34.0)
MCHC: 34.3 g/dL (ref 30.0–36.0)
MCV: 92 fL (ref 80.0–100.0)
Monocytes Absolute: 0.6 K/uL (ref 0.1–1.0)
Monocytes Relative: 9 %
Neutro Abs: 3.6 K/uL (ref 1.7–7.7)
Neutrophils Relative %: 56 %
Platelet Count: 277 K/uL (ref 150–400)
RBC: 4.6 MIL/uL (ref 3.87–5.11)
RDW: 12.8 % (ref 11.5–15.5)
WBC Count: 6.5 K/uL (ref 4.0–10.5)
nRBC: 0 % (ref 0.0–0.2)

## 2023-08-10 LAB — FERRITIN: Ferritin: 449 ng/mL — ABNORMAL HIGH (ref 11–307)

## 2023-08-10 LAB — CMP (CANCER CENTER ONLY)
ALT: 14 U/L (ref 0–44)
AST: 21 U/L (ref 15–41)
Albumin: 4.7 g/dL (ref 3.5–5.0)
Alkaline Phosphatase: 47 U/L (ref 38–126)
Anion gap: 11 (ref 5–15)
BUN: 11 mg/dL (ref 8–23)
CO2: 27 mmol/L (ref 22–32)
Calcium: 10.3 mg/dL (ref 8.9–10.3)
Chloride: 105 mmol/L (ref 98–111)
Creatinine: 1.12 mg/dL — ABNORMAL HIGH (ref 0.44–1.00)
GFR, Estimated: 54 mL/min — ABNORMAL LOW (ref >60)
Glucose, Bld: 96 mg/dL (ref 70–99)
Potassium: 5.5 mmol/L — ABNORMAL HIGH (ref 3.5–5.1)
Sodium: 143 mmol/L (ref 135–145)
Total Bilirubin: 1 mg/dL (ref 0.0–1.2)
Total Protein: 7.4 g/dL (ref 6.5–8.1)

## 2023-08-10 LAB — IRON AND IRON BINDING CAPACITY (CC-WL,HP ONLY)
Iron: 111 ug/dL (ref 28–170)
Saturation Ratios: 38 % — ABNORMAL HIGH (ref 10.4–31.8)
TIBC: 294 ug/dL (ref 250–450)
UIBC: 183 ug/dL

## 2023-08-10 NOTE — Progress Notes (Signed)
 Hematology and Oncology Follow Up Visit  Jane Obrien [id number] 1955-03-17 68 y.o. 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 She is having pain in her hips as well as dizziness and SOB with exertion. Her BP is a little low today. We discussed her establishing care with a PCP to monitor these issues. She plans to stop and make an appointment with [facility].  No fever, chills, n/v, cough, rash, dizziness, SOB, chest pain, palpitations, abdominal pain or changes in bowel or bladder habits at this time.  No falls or syncope reported.  No swelling in her extremities.  Appetite and hydration are good. Weight is stable at 150 lbs.   ECOG Performance Status: 1 - Symptomatic but completely ambulatory  Medications:  Allergies as of 08/10/2023       Reactions   Clindamycin/lincomycin Hives, Other (See Comments)   Blisters/open sores all over upper trunk, face and neck   Flagyl [metronidazole] Nausea Only, Other (See Comments)   Headaches, fatigue, felt bad when taking PILL form of Flagyl        Medication List        Accurate as of August 10, 2023 10:30 AM. If you have any questions, ask your nurse or doctor.          ALPRAZolam 0.5 MG tablet Commonly known as: XANAX Take 0.5 mg by mouth at bedtime as needed for sleep.   ESTRACE VAGINAL 0.1 MG/GM vaginal cream Generic drug: estradiol Place 1 Applicatorful vaginally daily.   LOSARTAN POTASSIUM-HCTZ PO   metroNIDAZOLE 0.75 % cream Commonly known as: METROCREAM Apply 1 Application topically 2 (two) times daily.   minoxidil 2 % external solution Commonly known as: ROGAINE Apply topically 2 (two) times daily.   polyethylene glycol powder 17 GM/SCOOP powder Commonly known as: GLYCOLAX/MIRALAX Take  1 Container by mouth daily.        Allergies:  Allergies  Allergen Reactions   Clindamycin/Lincomycin Hives and Other (See Comments)    Blisters/open sores all over upper trunk, face and neck   Flagyl [Metronidazole] Nausea Only and Other (See Comments)    Headaches, fatigue, felt bad when taking PILL form of Flagyl    Past Medical History, Surgical history, Social history, and Family History were reviewed and updated.  Review of Systems: All other 10 point review of systems is negative.   Physical Exam:  height is 5' 2 (1.575 m) and weight is 150 lb (68 kg). Her oral temperature is 98.6 F (37 C). Her blood pressure is 92/59 (abnormal) and her pulse is 84. Her respiration is 17 and oxygen saturation is 99%.   Wt Readings from Last 3 Encounters:  08/10/23 150 lb (68 kg)  04/17/23 151 lb 1.3 oz (68.5 kg)  01/06/23 150 lb (68 kg)    Ocular: Sclerae unicteric, pupils equal, round and reactive to light Ear-nose-throat: Oropharynx clear, dentition fair Lymphatic: No cervical or supraclavicular adenopathy Lungs no rales or rhonchi, good excursion bilaterally Heart regular rate and rhythm, no murmur appreciated Abd soft, nontender, positive bowel sounds MSK no focal spinal tenderness, no joint edema Neuro: non-focal, well-oriented, appropriate affect Breasts: Deferred   Lab Results  Component Value Date   WBC 6.5 08/10/2023   HGB 14.5 08/10/2023  HCT 42.3 08/10/2023   MCV 92.0 08/10/2023   PLT 277 08/10/2023   Lab Results  Component Value Date   FERRITIN 273 04/17/2023   IRON 96 04/17/2023   TIBC 322 04/17/2023   UIBC 226 04/17/2023   IRONPCTSAT 30 04/17/2023   Lab Results  Component Value Date   RBC 4.60 08/10/2023   No results found for: KPAFRELGTCHN, LAMBDASER, KAPLAMBRATIO No results found for: IGGSERUM, IGA, IGMSERUM No results found for: STEPHANY CARLOTA BENSON MARKEL EARLA JOANNIE DOC VICK, SPEI   Chemistry       Component Value Date/Time   NA 143 08/10/2023 0921   K 5.5 (H) 08/10/2023 0921   CL 105 08/10/2023 0921   CO2 27 08/10/2023 0921   BUN 11 08/10/2023 0921   CREATININE 1.12 (H) 08/10/2023 0921      Component Value Date/Time   CALCIUM 10.3 08/10/2023 0921   ALKPHOS 47 08/10/2023 0921   AST 21 08/10/2023 0921   ALT 14 08/10/2023 0921   BILITOT 1.0 08/10/2023 0921       Impression and Plan: Jane Obrien is a very pleasant 68 yo caucasian female with hemochromatosis.  Iron studies are pending. We will schedule phlebotomy if needed.  Follow-up in 3 months.   Lauraine Pepper, NP 7/14/202510:30 AM

## 2023-08-11 ENCOUNTER — Encounter: Payer: Self-pay | Admitting: Family

## 2023-08-17 ENCOUNTER — Inpatient Hospital Stay

## 2023-08-17 MED ORDER — SODIUM CHLORIDE 0.9 % IV SOLN: INTRAVENOUS | Status: DC

## 2023-08-17 NOTE — Progress Notes (Signed)
 Jane Obrien presents today for phlebotomy per MD orders. Phlebotomy procedure started at 0955 and ended at 1015 via 20 gauge angio cath to left ac.SABRA 525 grams removed without difficulty and then NS started at 500 ml/hr for one hour.. Snack and drink taken. Patient tolerated procedure well. IV needle removed intact.

## 2023-08-17 NOTE — Patient Instructions (Signed)

## 2023-08-24 ENCOUNTER — Inpatient Hospital Stay

## 2023-08-24 MED ORDER — SODIUM CHLORIDE 0.9 % IV SOLN: INTRAVENOUS | Status: DC

## 2023-08-24 NOTE — Progress Notes (Signed)
 1024[name] presents today for phlebotomy per MD orders. Phlebotomy procedure started at 1024 and ended at 1044. 510 cc removed via 20 G needle at [facility] site. IV replacement fluids given after phlebotomy. Patient tolerated procedure well. IV needle removed intact.

## 2023-08-24 NOTE — Patient Instructions (Signed)

## 2023-09-11 ENCOUNTER — Encounter: Payer: Self-pay | Admitting: Physician Assistant

## 2023-09-11 ENCOUNTER — Ambulatory Visit (INDEPENDENT_AMBULATORY_CARE_PROVIDER_SITE_OTHER): Admitting: Physician Assistant

## 2023-09-11 VITALS — BP 95/65 | HR 92 | Ht 62.0 in | Wt 152.4 lb

## 2023-09-11 DIAGNOSIS — I10 Essential (primary) hypertension: Secondary | ICD-10-CM | POA: Insufficient documentation

## 2023-09-11 DIAGNOSIS — F1090 Alcohol use, unspecified, uncomplicated: Secondary | ICD-10-CM | POA: Diagnosis not present

## 2023-09-11 DIAGNOSIS — M2550 Pain in unspecified joint: Secondary | ICD-10-CM | POA: Diagnosis not present

## 2023-09-11 DIAGNOSIS — E875 Hyperkalemia: Secondary | ICD-10-CM | POA: Diagnosis not present

## 2023-09-11 DIAGNOSIS — Z1211 Encounter for screening for malignant neoplasm of colon: Secondary | ICD-10-CM

## 2023-09-11 DIAGNOSIS — Z1331 Encounter for screening for depression: Secondary | ICD-10-CM | POA: Insufficient documentation

## 2023-09-11 DIAGNOSIS — Z87891 Personal history of nicotine dependence: Secondary | ICD-10-CM | POA: Insufficient documentation

## 2023-09-11 LAB — BASIC METABOLIC PANEL WITH GFR
BUN: 13 mg/dL (ref 6–23)
CO2: 26 meq/L (ref 19–32)
Calcium: 9.4 mg/dL (ref 8.4–10.5)
Chloride: 102 meq/L (ref 96–112)
Creatinine, Ser: 1.05 mg/dL (ref 0.40–1.20)
GFR: 54.46 mL/min — ABNORMAL LOW (ref >60.00)
Glucose, Bld: 92 mg/dL (ref 70–99)
Potassium: 4.7 meq/L (ref 3.5–5.1)
Sodium: 139 meq/L (ref 135–145)

## 2023-09-11 LAB — TSH: TSH: 3.41 u[IU]/mL (ref 0.35–5.50)

## 2023-09-11 MED ORDER — LOSARTAN POTASSIUM 25 MG PO TABS: 25.0000 mg | ORAL_TABLET | Freq: Every day | ORAL | 1 refills | Status: DC

## 2023-09-11 MED ORDER — MELOXICAM 7.5 MG PO TABS: 7.5000 mg | ORAL_TABLET | Freq: Every day | ORAL | 1 refills | Status: DC

## 2023-09-11 NOTE — Assessment & Plan Note (Signed)
 Reviewed cymbalta given her chronic pain Pt will consider Advised ref to therapy, she declines

## 2023-09-11 NOTE — Assessment & Plan Note (Signed)
 Rx losartan  25 mg continue to monitor symptoms/bp at home F/b 1 mo with new provider to review bp

## 2023-09-11 NOTE — Assessment & Plan Note (Signed)
 Will screen autoimmune disease, ana, rf  For now rx prn meloxicam  7.5 mg, recommend against drinking alcohol while taking Reviewed cymbalta w/ pt given elevated depression screening. She will consider

## 2023-09-11 NOTE — Progress Notes (Signed)
 New patient visit   Patient: Jane Obrien   DOB: 08-28-1955   68 y.o. Female  MRN: [id number] Visit Date: 09/11/2023  Today's healthcare provider: Manuelita Flatness, PA-C   Chief Complaint  Patient presents with   Establish Care   Hypertension    May need adjustment on bp med   Joint Pain   Headache    Last 3-4 days, pounding   Subjective    Jane Obrien is a 68 y.o. female who presents today as a new patient to establish care.   Discussed the use of AI scribe software for clinical note transcription with the patient, who gave verbal consent to proceed.  History of Present Illness   Jane Obrien is a 68 year old female with hypertension and joint pain who presents for evaluation of joint pain and blood pressure management.  She experiences chronic joint pain in her hips, knees, and back, which has worsened recently, affecting her mobility and daily activities. Ibuprofen provides relief but raises concerns about kidney health.  She has been on losartan  100 mg for hypertension for six to eight years. Recently, she began cutting the pills in half due to low blood pressure readings and dizziness, which she attributes to weight loss and phlebotomy treatments. She has been taking the reduced dose for about three weeks but continues to experience dizziness and questions the necessity of continuing the medication.  Her past medical history includes hereditary hemochromatosis with fluctuating ferritin levels and slightly below normal kidney function. She quit smoking in January 2015 after smoking for approximately 30-35 years. She drinks alcohol, typically a six-pack of beer a couple of times a week, but can go weeks without drinking.        Past Medical History:  Diagnosis Date   Allergy    Anxiety    Hypertension    Past Surgical History:  Procedure Laterality Date   POSTERIOR REPAIR  2009   TONSILLECTOMY     Family Status  Relation Name Status   Mother  Alive   MGM  F (Not Specified)  No partnership data on file   Family History  Problem Relation Age of Onset   Diabetes Mother    Cancer Maternal Grandmother        colon   Social History   Socioeconomic History   Marital status: Divorced    Spouse name: Not on file   Number of children: Not on file   Years of education: Not on file   Highest education level: Not on file  Occupational History   Not on file  Tobacco Use   Smoking status: Former    Current packs/day: 0.00    Average packs/day: 1 pack/day for 30.0 years (30.0 ttl pk-yrs)    Types: Cigarettes    Start date: 40    Quit date: 2015    Years since quitting: 10.6   Smokeless tobacco: Never  Vaping Use   Vaping status: Never Used  Substance and Sexual Activity   Alcohol use: Yes    Alcohol/week: 18.0 standard drinks of alcohol    Types: 18 Standard drinks or equivalent per week    Comment: 6 pack of beer 3-4 times a week   Drug use: No   Sexual activity: Not Currently    Birth control/protection: None  Other Topics Concern   Not on file  Social History Narrative   Not on file   Social Drivers of Health   Financial Resource Strain: Patient  Declined (09/05/2023)   Overall Financial Resource Strain (CARDIA)    Difficulty of Paying Living Expenses: Patient declined  Food Insecurity: Patient Declined (09/05/2023)   Hunger Vital Sign    Worried About Running Out of Food in the Last Year: Patient declined    Ran Out of Food in the Last Year: Patient declined  Transportation Needs: Patient Declined (09/05/2023)   PRAPARE - Administrator, Civil Service (Medical): Patient declined    Lack of Transportation (Non-Medical): Patient declined  Physical Activity: Unknown (09/05/2023)   Exercise Vital Sign    Days of Exercise per Week: Patient declined    Minutes of Exercise per Session: Not on file  Stress: Patient Declined (09/05/2023)   [employer] of Occupational Health - Occupational Stress Questionnaire     Feeling of Stress: Patient declined  Social Connections: Unknown (09/05/2023)   Social Connection and Isolation Panel    Frequency of Communication with Friends and Family: Patient declined    Frequency of Social Gatherings with Friends and Family: Patient declined    Attends Religious Services: Patient declined    Database administrator or Organizations: Patient declined    Attends Banker Meetings: Not on file    Marital Status: Patient declined   Outpatient Medications Prior to Visit  Medication Sig   [DISCONTINUED] losartan  (COZAAR ) 100 MG tablet Take 100 mg by mouth daily. (Patient taking differently: Take 50 mg by mouth daily.)   ALPRAZolam (XANAX) 0.5 MG tablet Take 0.5 mg by mouth at bedtime as needed for sleep.   estradiol (ESTRACE VAGINAL) 0.1 MG/GM vaginal cream Place 1 Applicatorful vaginally daily.   MINOXIDIL PO Take 2.5 mg by mouth daily. 1/2 tab daily   polyethylene glycol powder (GLYCOLAX/MIRALAX) 17 GM/SCOOP powder Take 1 Container by mouth daily.   [DISCONTINUED] LOSARTAN  POTASSIUM-HCTZ PO    [DISCONTINUED] metroNIDAZOLE (METROCREAM) 0.75 % cream Apply 1 Application topically 2 (two) times daily.   [DISCONTINUED] minoxidil (ROGAINE) 2 % external solution Apply topically 2 (two) times daily.   No facility-administered medications prior to visit.   Allergies  Allergen Reactions   Clindamycin/Lincomycin Hives and Other (See Comments)    Blisters/open sores all over upper trunk, face and neck   Flagyl [Metronidazole] Nausea Only and Other (See Comments)    Headaches, fatigue, felt bad when taking PILL form of Flagyl    Immunization History  Administered Date(s) Administered   PFIZER(Purple Top)SARS-COV-2 Vaccination 04/02/2019, 04/23/2019, 11/12/2019    Health Maintenance  Topic Date Due   Medicare Annual Wellness (AWV)  Never done   Hepatitis C Screening  Never done   DTaP/Tdap/Td (1 - Tdap) Never done   Colonoscopy  Never done   Lung Cancer  Screening  Never done   Pneumococcal Vaccine: 50+ Years (1 of 1 - PCV) Never done   Zoster Vaccines- Shingrix (1 of 2) Never done   MAMMOGRAM  12/27/2013   DEXA SCAN  Never done   COVID-19 Vaccine (4 - 2024-25 season) 09/28/2022   INFLUENZA VACCINE  08/28/2023   HPV VACCINES  Aged Out   Meningococcal B Vaccine  Aged Out    Patient Care Team: Cyndi Shaver, PA-C as PCP - General (Physician Assistant)  Review of Systems  Constitutional:  Negative for fatigue and fever.  Respiratory:  Negative for cough and shortness of breath.   Cardiovascular:  Negative for chest pain and leg swelling.  Gastrointestinal:  Negative for abdominal pain.  Musculoskeletal:  Positive for arthralgias and myalgias.  Neurological:  Positive for light-headedness. Negative for dizziness and headaches.        Objective    BP 95/65   Pulse 92   Ht 5' 2 (1.575 m)   Wt 152 lb 6.4 oz (69.1 kg)   BMI 27.87 kg/m     Physical Exam Constitutional:      General: She is awake.     Appearance: She is well-developed.  HENT:     Head: Normocephalic.  Eyes:     Conjunctiva/sclera: Conjunctivae normal.  Cardiovascular:     Rate and Rhythm: Normal rate and regular rhythm.     Heart sounds: Normal heart sounds.  Pulmonary:     Effort: Pulmonary effort is normal.     Breath sounds: Normal breath sounds.  Skin:    General: Skin is warm.  Neurological:     Mental Status: She is alert and oriented to person, place, and time.  Psychiatric:        Attention and Perception: Attention normal.        Mood and Affect: Mood normal.        Speech: Speech normal.        Behavior: Behavior is cooperative.    Depression Screen    09/11/2023    9:14 AM 08/24/2023   10:12 AM 08/10/2023    9:44 AM  PHQ 2/9 Scores  PHQ - 2 Score 6 0 0  PHQ- 9 Score 24     Results for orders placed or performed in visit on 09/11/23  Cologuard  Result Value Ref Range   Cologuard Negative Negative    Assessment & Plan      Primary hypertension Assessment & Plan: Rx losartan  25 mg continue to monitor symptoms/bp at home F/b 1 mo with new provider to review bp  Orders: -     Losartan  Potassium; Take 1 tablet (25 mg total) by mouth daily.  Dispense: 90 tablet; Refill: 1 -     TSH  Arthralgia, unspecified joint Assessment & Plan: Will screen autoimmune disease, ana, rf  For now rx prn meloxicam  7.5 mg, recommend against drinking alcohol while taking Reviewed cymbalta w/ pt given elevated depression screening. She will consider  Orders: -     ANA -     Rheumatoid factor -     Meloxicam ; Take 1 tablet (7.5 mg total) by mouth daily.  Dispense: 30 tablet; Refill: 1  Hyperkalemia -     Basic metabolic panel with GFR  Alcohol use Assessment & Plan: Excessive use. Recommend cessation   History of tobacco use Assessment & Plan: Pt has extensive smoking history but quit for 10 years, does not qualify for ldct any longer   Hereditary hemochromatosis (HCC) Assessment & Plan: Follows with hemeonc   Positive depression screening Assessment & Plan: Reviewed cymbalta given her chronic pain Pt will consider Advised ref to therapy, she declines   Colon cancer screening -     Cologuard    Will obtain records from gyn for mammo, pap, dexa scans. Vaccine history needed.  Return in about 4 weeks (around 10/09/2023) for hypertension.      Manuelita Flatness, PA-C  [facility] [phone] (phone) [phone] (fax)  [facility]

## 2023-09-11 NOTE — Assessment & Plan Note (Signed)
 Follows with heme onc

## 2023-09-11 NOTE — Assessment & Plan Note (Signed)
 Excessive use. Recommend cessation

## 2023-09-11 NOTE — Assessment & Plan Note (Signed)
 Pt has extensive smoking history but quit for 10 years, does not qualify for ldct any longer

## 2023-09-13 LAB — ANTI-NUCLEAR AB-TITER (ANA TITER): ANA Titer 1: 1:320 {titer} — ABNORMAL HIGH

## 2023-09-13 LAB — ANA: Anti Nuclear Antibody (ANA): POSITIVE — AB

## 2023-09-13 LAB — RHEUMATOID FACTOR: Rheumatoid fact SerPl-aCnc: <10 [IU]/mL (ref <14)

## 2023-09-14 ENCOUNTER — Encounter: Payer: Self-pay | Admitting: *Deleted

## 2023-09-15 ENCOUNTER — Other Ambulatory Visit: Payer: Self-pay | Admitting: Physician Assistant

## 2023-09-15 ENCOUNTER — Ambulatory Visit: Payer: Self-pay | Admitting: Physician Assistant

## 2023-10-14 ENCOUNTER — Ambulatory Visit (INDEPENDENT_AMBULATORY_CARE_PROVIDER_SITE_OTHER): Admitting: Student

## 2023-10-14 ENCOUNTER — Encounter: Payer: Self-pay | Admitting: Student

## 2023-10-14 VITALS — BP 130/80 | HR 75 | Temp 97.8°F | Resp 12 | Ht 62.0 in | Wt 150.4 lb

## 2023-10-14 DIAGNOSIS — I10 Essential (primary) hypertension: Secondary | ICD-10-CM | POA: Diagnosis not present

## 2023-10-14 DIAGNOSIS — F32A Depression, unspecified: Secondary | ICD-10-CM

## 2023-10-14 DIAGNOSIS — R768 Other specified abnormal immunological findings in serum: Secondary | ICD-10-CM | POA: Diagnosis not present

## 2023-10-14 DIAGNOSIS — F419 Anxiety disorder, unspecified: Secondary | ICD-10-CM | POA: Diagnosis not present

## 2023-10-14 DIAGNOSIS — M2550 Pain in unspecified joint: Secondary | ICD-10-CM

## 2023-10-14 MED ORDER — DULOXETINE HCL 30 MG PO CPEP: 30.0000 mg | ORAL_CAPSULE | Freq: Every day | ORAL | 1 refills | Status: DC

## 2023-10-14 MED ORDER — LOSARTAN POTASSIUM 25 MG PO TABS: 25.0000 mg | ORAL_TABLET | Freq: Every day | ORAL | 1 refills | Status: DC

## 2023-10-14 NOTE — Patient Instructions (Signed)
 Get annual wellness visit with nurse.  Get Tdap and Shingles at pharmacy.

## 2023-10-14 NOTE — Progress Notes (Signed)
 Chief Complaint  Patient presents with   bp follow up        New Patient Visit SUBJECTIVE: HPI: Jane Obrien is an 68 y.o.female who is being seen for follow up and TOC. Presents with concerns about blood pressure management and joint pain. She is concerned about discrepancies between her home blood pressure readings, which are lower, and those recorded in the clinic. She reports being on losartan  for years, initially at a higher dose, but reduced it to 25 mg daily due to dizziness. She has maintained this lower dose for the past month.   She experiences significant joint pain, particularly in her knees, and has elevated autoimmune markers (ANA).  She seeks options to alleviate joint pain without affecting her kidneys.  She has chronic anxiety and depression with symptoms including difficulty sleeping and persistent unhappiness. Denies SI/HI. She has been reducing her alcohol intake but has not stopped completely. Reports previous medications for anxiety and depression were ineffective due to side effects (dry mouth, constipation). She uses Xanax,  primarily for sleep. She uses it sparingly during the day and mainly for sleep.  Patient denies fever, chills, SOB, CP, palpitations, dyspnea, edema, HA, vision changes, N/V/D, abdominal pain, urinary symptoms, rash, weight changes, and recent illness or hospitalizations.     Past Medical History:  Diagnosis Date   Allergy    Anxiety    Hypertension    Past Surgical History:  Procedure Laterality Date   POSTERIOR REPAIR  2009   TONSILLECTOMY     Family History  Problem Relation Age of Onset   Diabetes Mother    Cancer Maternal Grandmother        colon   Allergies  Allergen Reactions   Clindamycin/Lincomycin Hives and Other (See Comments)    Blisters/open sores all over upper trunk, face and neck   Flagyl [Metronidazole] Nausea Only and Other (See Comments)    Headaches, fatigue, felt bad when taking PILL form of Flagyl     Current Outpatient Medications:    ALPRAZolam (XANAX) 0.5 MG tablet, Take 0.5 mg by mouth at bedtime as needed for sleep., Disp: , Rfl:    DULoxetine  (CYMBALTA ) 30 MG capsule, Take 1 capsule (30 mg total) by mouth daily., Disp: 30 capsule, Rfl: 1   estradiol (ESTRACE VAGINAL) 0.1 MG/GM vaginal cream, Place 1 Applicatorful vaginally daily., Disp: , Rfl:    MINOXIDIL PO, Take 2.5 mg by mouth daily. 1/2 tab daily, Disp: , Rfl:    polyethylene glycol powder (GLYCOLAX/MIRALAX) 17 GM/SCOOP powder, Take 1 Container by mouth daily., Disp: , Rfl:    losartan  (COZAAR ) 25 MG tablet, Take 1 tablet (25 mg total) by mouth daily., Disp: 90 tablet, Rfl: 1  PHQ9 Today:    10/14/2023    9:06 AM 09/11/2023    9:14 AM 08/24/2023   10:12 AM  Depression screen PHQ 2/9  Decreased Interest 3 3 0  Down, Depressed, Hopeless 3 3 0  PHQ - 2 Score 6 6 0  Altered sleeping 3 3   Tired, decreased energy 3 3   Change in appetite 0 3   Feeling bad or failure about yourself  3 3   Trouble concentrating 3 3   Moving slowly or fidgety/restless 3 3   Suicidal thoughts 0 0   PHQ-9 Score 21 24   Difficult doing work/chores Somewhat difficult Not difficult at all    GAD7 Today:    10/14/2023    9:10 AM 09/11/2023    9:15 AM  GAD 7 : Generalized Anxiety Score  Nervous, Anxious, on Edge 3 3  Control/stop worrying 3 3  Worry too much - different things 3 3  Trouble relaxing 3 3  Restless 3 3  Easily annoyed or irritable 3 3  Afraid - awful might happen 3 3  Total GAD 7 Score 21 21  Anxiety Difficulty Somewhat difficult Not difficult at all    OBJECTIVE: BP 130/80 (BP Location: Left Arm, Patient Position: Sitting, Cuff Size: Normal)   Pulse 75   Temp 97.8 F (36.6 C) (Oral)   Resp 12   Ht 5' 2 (1.575 m)   Wt 150 lb 6.4 oz (68.2 kg)   SpO2 97%   BMI 27.51 kg/m  General:  well developed, well nourished, in no apparent distress Skin:  no significant moles, warts, or growths Nose:  nares patent, septum  midline, mucosa normal Throat/Pharynx:  lips and gingiva without lesion; tongue and uvula midline; non-inflamed pharynx; no exudates or postnasal drainage Lungs:  clear to auscultation, breath sounds equal bilaterally, no respiratory distress Cardio:  regular rate and rhythm, no LE edema or bruits Musculoskeletal:  symmetrical muscle groups noted without atrophy or deformity Neuro:  gait normal Psych: well oriented with normal range of affect and appropriate judgment/insight  ASSESSMENT/PLAN: Anxiety and depression - Plan: DULoxetine  (CYMBALTA ) 30 MG capsule  Primary hypertension - Plan: losartan  (COZAAR ) 25 MG tablet  Positive ANA (antinuclear antibody) - Plan: Ambulatory referral to Rheumatology  Arthralgia, unspecified joint  Joint Pain Chronic joint pain with elevated autoimmune markers. - Refer to rheumatology for further evaluation  - Start Cymbalta  30 mg daily, reassess in four weeks.  Anxiety and depression Chronic, anxiety and depression. Denies SI/HI. Uses Xanax for sleep. -Advised counseling/ therapy- Pt denies at this time - Rx- Start Cymbalta  30 mg daily, reassess in four weeks. - Advised reducing alcohol intake due to its impact on anxiety and depression.  Hypertension Home BP monitor reading lower than OV reading today. Pt brought home BP monitor with her to compare. Advise getting new BP monitor as hers is a few years old and much lower than OV readings.   Office BP today 130/80. - Continue losartan  25 mg daily. - Rx losartan  refill to pharmacy.  The patient voiced understanding and agreement to the plan. Education provided today during visit and on AVS for patient to review at home.  Diet and Exercise recommendations provided.  Current diagnoses and recommendations discussed. HM recommendations reviewed with recommendations.   Patient should return 4 weeks Return in about 4 weeks (around 11/11/2023).   Harlene LITTIE Jolly, DNP, AGNP-C 10/14/23  9:55 AM

## 2023-10-15 ENCOUNTER — Encounter: Payer: Self-pay | Admitting: Family

## 2023-10-15 ENCOUNTER — Encounter: Payer: Self-pay | Admitting: Student

## 2023-11-10 ENCOUNTER — Ambulatory Visit: Admitting: Family

## 2023-11-10 ENCOUNTER — Inpatient Hospital Stay

## 2023-11-11 ENCOUNTER — Ambulatory Visit: Admitting: Student

## 2023-11-17 ENCOUNTER — Encounter: Payer: Self-pay | Admitting: Family

## 2023-11-17 ENCOUNTER — Other Ambulatory Visit: Payer: Self-pay

## 2023-11-17 ENCOUNTER — Inpatient Hospital Stay: Admitting: Family

## 2023-11-17 ENCOUNTER — Inpatient Hospital Stay: Attending: Hematology & Oncology

## 2023-11-17 LAB — CMP (CANCER CENTER ONLY)
ALT: 10 U/L (ref 0–44)
AST: 20 U/L (ref 15–41)
Albumin: 4.7 g/dL (ref 3.5–5.0)
Alkaline Phosphatase: 50 U/L (ref 38–126)
Anion gap: 13 (ref 5–15)
BUN: 13 mg/dL (ref 8–23)
CO2: 25 mmol/L (ref 22–32)
Calcium: 9.8 mg/dL (ref 8.9–10.3)
Chloride: 106 mmol/L (ref 98–111)
Creatinine: 0.99 mg/dL (ref 0.44–1.00)
GFR, Estimated: >60 mL/min (ref >60)
Glucose, Bld: 94 mg/dL (ref 70–99)
Potassium: 4.3 mmol/L (ref 3.5–5.1)
Sodium: 144 mmol/L (ref 135–145)
Total Bilirubin: 0.9 mg/dL (ref 0.0–1.2)
Total Protein: 7.2 g/dL (ref 6.5–8.1)

## 2023-11-17 LAB — CBC WITH DIFFERENTIAL (CANCER CENTER ONLY)
Abs Immature Granulocytes: 0.03 K/uL (ref 0.00–0.07)
Basophils Absolute: 0.1 K/uL (ref 0.0–0.1)
Basophils Relative: 1 %
Eosinophils Absolute: 0.2 K/uL (ref 0.0–0.5)
Eosinophils Relative: 2 %
HCT: 41.7 % (ref 36.0–46.0)
Hemoglobin: 14.3 g/dL (ref 12.0–15.0)
Immature Granulocytes: 0 %
Lymphocytes Relative: 27 %
Lymphs Abs: 1.9 K/uL (ref 0.7–4.0)
MCH: 31.1 pg (ref 26.0–34.0)
MCHC: 34.3 g/dL (ref 30.0–36.0)
MCV: 90.7 fL (ref 80.0–100.0)
Monocytes Absolute: 0.7 K/uL (ref 0.1–1.0)
Monocytes Relative: 10 %
Neutro Abs: 4.2 K/uL (ref 1.7–7.7)
Neutrophils Relative %: 60 %
Platelet Count: 227 K/uL (ref 150–400)
RBC: 4.6 MIL/uL (ref 3.87–5.11)
RDW: 12.7 % (ref 11.5–15.5)
WBC Count: 7 K/uL (ref 4.0–10.5)
nRBC: 0 % (ref 0.0–0.2)

## 2023-11-17 LAB — IRON AND IRON BINDING CAPACITY (CC-WL,HP ONLY)
Iron: 111 ug/dL (ref 28–170)
Saturation Ratios: 36 % — ABNORMAL HIGH (ref 10.4–31.8)
TIBC: 305 ug/dL (ref 250–450)
UIBC: 194 ug/dL

## 2023-11-17 LAB — FERRITIN: Ferritin: 132 ng/mL (ref 11–307)

## 2023-11-17 NOTE — Progress Notes (Signed)
 Hematology and Oncology Follow Up Visit  Jane Obrien [id number] 10-29-1955 68 y.o. 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 She is still having a persistent joint pain. This is the worst in her hips and right knee. She states that her inflammation markers were positive and she was referred to rheumatology. She sees them on 12/21/2023.  She has not started the Cymbalta  yet. She is still a little leery.  She will be having bilateral brow lift surgery on 12/08/2023.  She denies any blood loss. No bruising or petechiae.  No fever, chills, n/v, cough, rash, dizziness, SOB, chest pain, palpitations, abdominal pain or changes in bowel or bladder habits.  No numbness or tingling in her extremities.  No falls or syncope reported.  Appetite and hydration are good. Weight is stable at 151 lbs.   ECOG Performance Status: 1 - Symptomatic but completely ambulatory  Medications:  Allergies as of 11/17/2023       Reactions   Clindamycin/lincomycin Hives, Other (See Comments)   Blisters/open sores all over upper trunk, face and neck   Flagyl [metronidazole] Nausea Only, Other (See Comments)   Headaches, fatigue, felt bad when taking PILL form of Flagyl        Medication List        Accurate as of November 17, 2023  8:44 AM. If you have any questions, ask your nurse or doctor.          ALPRAZolam 0.5 MG tablet Commonly known as: XANAX Take 0.5 mg by mouth at bedtime as needed for sleep.   DULoxetine  30 MG capsule Commonly known as: Cymbalta  Take 1 capsule (30 mg total) by mouth daily.   ESTRACE VAGINAL 0.1 MG/GM vaginal cream Generic drug: estradiol Place 1 Applicatorful vaginally daily.   losartan  25 MG tablet Commonly known as: COZAAR  Take 1 tablet (25 mg  total) by mouth daily.   MINOXIDIL PO Take 2.5 mg by mouth daily. 1/2 tab daily   polyethylene glycol powder 17 GM/SCOOP powder Commonly known as: GLYCOLAX/MIRALAX Take 1 Container by mouth daily.        Allergies:  Allergies  Allergen Reactions   Clindamycin/Lincomycin Hives and Other (See Comments)    Blisters/open sores all over upper trunk, face and neck   Flagyl [Metronidazole] Nausea Only and Other (See Comments)    Headaches, fatigue, felt bad when taking PILL form of Flagyl    Past Medical History, Surgical history, Social history, and Family History were reviewed and updated.  Review of Systems: All other 10 point review of systems is negative.   Physical Exam:  weight is 151 lb (68.5 kg). Her oral temperature is 98.4 F (36.9 C). Her blood pressure is 94/66 and her pulse is 85. Her oxygen saturation is 85% (abnormal).   Wt Readings from Last 3 Encounters:  11/17/23 151 lb (68.5 kg)  10/14/23 150 lb 6.4 oz (68.2 kg)  09/11/23 152 lb 6.4 oz (69.1 kg)    Ocular: Sclerae unicteric, pupils equal, round and reactive to light Ear-nose-throat: Oropharynx clear, dentition fair Lymphatic: No cervical or supraclavicular adenopathy Lungs no rales or rhonchi, good excursion bilaterally Heart regular rate and rhythm, no murmur appreciated Abd soft, nontender, positive bowel sounds MSK no focal spinal tenderness, no joint  edema Neuro: non-focal, well-oriented, appropriate affect Breasts: Deferred   Lab Results  Component Value Date   WBC 7.0 11/17/2023   HGB 14.3 11/17/2023   HCT 41.7 11/17/2023   MCV 90.7 11/17/2023   PLT 227 11/17/2023   Lab Results  Component Value Date   FERRITIN 449 (H) 08/10/2023   IRON 111 08/10/2023   TIBC 294 08/10/2023   UIBC 183 08/10/2023   IRONPCTSAT 38 (H) 08/10/2023   Lab Results  Component Value Date   RBC 4.60 11/17/2023   No results found for: KPAFRELGTCHN, LAMBDASER, KAPLAMBRATIO No results found for: IGGSERUM,  IGA, IGMSERUM No results found for: STEPHANY CARLOTA BENSON MARKEL EARLA JOANNIE DOC VICK, SPEI   Chemistry      Component Value Date/Time   NA 139 09/11/2023 0953   K 4.7 09/11/2023 0953   CL 102 09/11/2023 0953   CO2 26 09/11/2023 0953   BUN 13 09/11/2023 0953   CREATININE 1.05 09/11/2023 0953   CREATININE 1.12 (H) 08/10/2023 0921      Component Value Date/Time   CALCIUM 9.4 09/11/2023 0953   ALKPHOS 47 08/10/2023 0921   AST 21 08/10/2023 0921   ALT 14 08/10/2023 0921   BILITOT 1.0 08/10/2023 0921       Impression and Plan: Jane Obrien is a very pleasant 68 yo caucasian female with hemochromatosis.  Iron studies are pending. We will schedule phlebotomy if needed.  Follow-up in 3 months.   Lauraine Pepper, NP 10/21/20258:44 AM

## 2023-11-20 ENCOUNTER — Telehealth: Payer: Self-pay | Admitting: Family

## 2023-11-20 NOTE — Telephone Encounter (Signed)
 Called to schedule phleb per inbasket. LVM to return call for scheduling.

## 2023-11-25 ENCOUNTER — Inpatient Hospital Stay

## 2023-11-25 MED ORDER — SODIUM CHLORIDE 0.9 % IV SOLN: INTRAVENOUS | Status: DC

## 2023-11-25 NOTE — Progress Notes (Signed)
 Jane Obrien presents today for phlebotomy per MD orders. Phlebotomy procedure started at  0930 and ended at 0945. 515 grams removed. NS 500 ml up for 1 hour, post phlebotomy.  Patient tolerated procedure well. IV needle removed intact.

## 2023-11-25 NOTE — Patient Instructions (Signed)

## 2023-12-21 ENCOUNTER — Encounter

## 2024-02-16 ENCOUNTER — Inpatient Hospital Stay: Admitting: Family

## 2024-02-16 ENCOUNTER — Inpatient Hospital Stay

## 2024-03-04 ENCOUNTER — Other Ambulatory Visit: Payer: Self-pay

## 2024-03-04 ENCOUNTER — Encounter: Payer: Self-pay | Admitting: Student

## 2024-03-04 ENCOUNTER — Inpatient Hospital Stay

## 2024-03-04 ENCOUNTER — Encounter: Payer: Self-pay | Admitting: Family

## 2024-03-04 ENCOUNTER — Ambulatory Visit: Admitting: Student

## 2024-03-04 ENCOUNTER — Inpatient Hospital Stay: Admitting: Family

## 2024-03-04 DIAGNOSIS — M2550 Pain in unspecified joint: Secondary | ICD-10-CM

## 2024-03-04 DIAGNOSIS — I10 Essential (primary) hypertension: Secondary | ICD-10-CM

## 2024-03-04 DIAGNOSIS — Z87891 Personal history of nicotine dependence: Secondary | ICD-10-CM

## 2024-03-04 DIAGNOSIS — K649 Unspecified hemorrhoids: Secondary | ICD-10-CM | POA: Insufficient documentation

## 2024-03-04 DIAGNOSIS — E559 Vitamin D deficiency, unspecified: Secondary | ICD-10-CM

## 2024-03-04 DIAGNOSIS — F32A Depression, unspecified: Secondary | ICD-10-CM

## 2024-03-04 LAB — CBC WITH DIFFERENTIAL (CANCER CENTER ONLY)
Abs Immature Granulocytes: 0.03 10*3/uL (ref 0.00–0.07)
Basophils Absolute: 0.1 10*3/uL (ref 0.0–0.1)
Basophils Relative: 1 %
Eosinophils Absolute: 0.1 10*3/uL (ref 0.0–0.5)
Eosinophils Relative: 1 %
HCT: 41.8 % (ref 36.0–46.0)
Hemoglobin: 14.5 g/dL (ref 12.0–15.0)
Immature Granulocytes: 0 %
Lymphocytes Relative: 23 %
Lymphs Abs: 1.9 10*3/uL (ref 0.7–4.0)
MCH: 31.2 pg (ref 26.0–34.0)
MCHC: 34.7 g/dL (ref 30.0–36.0)
MCV: 89.9 fL (ref 80.0–100.0)
Monocytes Absolute: 0.7 10*3/uL (ref 0.1–1.0)
Monocytes Relative: 9 %
Neutro Abs: 5.4 10*3/uL (ref 1.7–7.7)
Neutrophils Relative %: 66 %
Platelet Count: 256 10*3/uL (ref 150–400)
RBC: 4.65 MIL/uL (ref 3.87–5.11)
RDW: 13.1 % (ref 11.5–15.5)
WBC Count: 8.2 10*3/uL (ref 4.0–10.5)
nRBC: 0 % (ref 0.0–0.2)

## 2024-03-04 LAB — IRON AND IRON BINDING CAPACITY (CC-WL,HP ONLY)
Iron: 80 ug/dL (ref 28–170)
Saturation Ratios: 25 % (ref 10.4–31.8)
TIBC: 323 ug/dL (ref 250–450)
UIBC: 243 ug/dL

## 2024-03-04 LAB — CMP (CANCER CENTER ONLY)
ALT: 9 U/L (ref 0–44)
AST: 20 U/L (ref 15–41)
Albumin: 4.9 g/dL (ref 3.5–5.0)
Alkaline Phosphatase: 59 U/L (ref 38–126)
Anion gap: 16 — ABNORMAL HIGH (ref 5–15)
BUN: 20 mg/dL (ref 8–23)
CO2: 24 mmol/L (ref 22–32)
Calcium: 10 mg/dL (ref 8.9–10.3)
Chloride: 102 mmol/L (ref 98–111)
Creatinine: 1.04 mg/dL — ABNORMAL HIGH (ref 0.44–1.00)
GFR, Estimated: 58 mL/min — ABNORMAL LOW
Glucose, Bld: 91 mg/dL (ref 70–99)
Potassium: 4.8 mmol/L (ref 3.5–5.1)
Sodium: 142 mmol/L (ref 135–145)
Total Bilirubin: 1 mg/dL (ref 0.0–1.2)
Total Protein: 7.9 g/dL (ref 6.5–8.1)

## 2024-03-04 LAB — FERRITIN: Ferritin: 113 ng/mL (ref 11–307)

## 2024-03-04 MED ORDER — HYDROCORTISONE (PERIANAL) 2.5 % EX CREA: 1.0000 | TOPICAL_CREAM | Freq: Two times a day (BID) | CUTANEOUS | 0 refills | Status: AC

## 2024-03-04 MED ORDER — HYDROCORTISONE (PERIANAL) 2.5 % EX CREA: 1.0000 | TOPICAL_CREAM | Freq: Two times a day (BID) | CUTANEOUS | 0 refills | Status: DC

## 2024-03-04 MED ORDER — LOSARTAN POTASSIUM 25 MG PO TABS: 25.0000 mg | ORAL_TABLET | Freq: Every day | ORAL | 1 refills | Status: AC

## 2024-03-04 NOTE — Assessment & Plan Note (Signed)
 Using Xanax for sleep as needed prn. Rx prescribed by GYN.

## 2024-03-04 NOTE — Assessment & Plan Note (Signed)
 Well controlled, no changes to meds. Encouraged heart healthy diet such as the DASH diet and exercise as tolerated.

## 2024-03-04 NOTE — Assessment & Plan Note (Signed)
 Chronic hemorrhoids with itching and discomfort.  - Rx- Anusol  - Advised use of Tucks OTC - Recommended prevention strategies including adequate fiber intake and avoiding straining. RTC if persist or worsens

## 2024-03-04 NOTE — Progress Notes (Signed)
 "  Subjective:     Patient ID: Jane Obrien, female    DOB: 1955/03/21, 69 y.o.   MRN: [id number]  Chief Complaint  Patient presents with   Hypertension    Patient is here to have her blood pressure check. Medication management for blood pressure    HPI  Discussed the use of AI scribe software for clinical note transcription with the patient, who gave verbal consent to proceed.  History of Present Illness         History of Present Illness Jane Obrien is a 69 year old female who presents for a follow-up visit.  She takes losartan  25 mg daily for blood pressure. Denies adverse SEs  She has ongoing anxiety and depression that are unchanged. She has not started Cymbalta  and is not interested in counseling or medications.  She has hemorrhoids, can be pushed back in, though they do not stay reduced. She uses Tucks pads after bowel movements and requests a cream for itching and symptom relief.  She quit smoking in 2015 and is not interested in lung cancer screening.  She is scheduled to see rheumatology next month for joint pain and possible autoimmune disease. She lives with and cares for her 63 year old mother, which motivates her to address joint pain.  She has not received the second shingles vaccine and is hesitant about vaccinations. Declines today. She takes prenatal vitamins without iron and uses minoxidil for hair loss.  GYN: [place]  Patient denies fever, chills, SOB, CP, palpitations, dyspnea, edema, HA, vision changes, N/V/D, abdominal pain, urinary symptoms, rash, weight changes, and recent illness or hospitalizations.    Health Maintenance Due  Topic Date Due   Medicare Annual Wellness (AWV)  Never done   Bone Density Scan  Never done   Mammogram  03/04/2024    Past Medical History:  Diagnosis Date   Allergy    Anxiety    Hypertension     Past Surgical History:  Procedure Laterality Date   POSTERIOR REPAIR  2009    TONSILLECTOMY      Family History  Problem Relation Age of Onset   Diabetes Mother    Cancer Maternal Grandmother        colon    Social History   Socioeconomic History   Marital status: Divorced    Spouse name: Not on file   Number of children: Not on file   Years of education: Not on file   Highest education level: Not on file  Occupational History   Not on file  Tobacco Use   Smoking status: Former    Current packs/day: 0.00    Average packs/day: 1 pack/day for 30.0 years (30.0 ttl pk-yrs)    Types: Cigarettes    Start date: 65    Quit date: 2015    Years since quitting: 11.1   Smokeless tobacco: Never  Vaping Use   Vaping status: Never Used  Substance and Sexual Activity   Alcohol use: Yes    Alcohol/week: 18.0 standard drinks of alcohol    Types: 18 Standard drinks or equivalent per week    Comment: 6 pack of beer 3-4 times a week   Drug use: No   Sexual activity: Not Currently    Birth control/protection: None  Other Topics Concern   Not on file  Social History Narrative   Not on file   Social Drivers of Health   Tobacco Use: Medium Risk (03/04/2024)   Patient History  Smoking Tobacco Use: Former    Smokeless Tobacco Use: Never    Passive Exposure: Not on file  Financial Resource Strain: Patient Declined (09/05/2023)   Overall Financial Resource Strain (CARDIA)    Difficulty of Paying Living Expenses: Patient declined  Food Insecurity: Patient Declined (09/05/2023)   Epic    Worried About Programme Researcher, Broadcasting/film/video in the Last Year: Patient declined    Barista in the Last Year: Patient declined  Transportation Needs: Patient Declined (09/05/2023)   Epic    Lack of Transportation (Medical): Patient declined    Lack of Transportation (Non-Medical): Patient declined  Physical Activity: Unknown (09/05/2023)   Exercise Vital Sign    Days of Exercise per Week: Patient declined    Minutes of Exercise per Session: Not on file  Stress: Patient Declined  (09/05/2023)   [employer] of Occupational Health - Occupational Stress Questionnaire    Feeling of Stress: Patient declined  Social Connections: Unknown (09/05/2023)   Social Connection and Isolation Panel    Frequency of Communication with Friends and Family: Patient declined    Frequency of Social Gatherings with Friends and Family: Patient declined    Attends Religious Services: Patient declined    Active Member of Clubs or Organizations: Patient declined    Attends Banker Meetings: Not on file    Marital Status: Patient declined  Intimate Partner Violence: Not on file  Depression (PHQ2-9): Low Risk (11/17/2023)   Depression (PHQ2-9)    PHQ-2 Score: 0  Recent Concern: Depression (PHQ2-9) - High Risk (10/14/2023)   Depression (PHQ2-9)    PHQ-2 Score: 21  Alcohol Screen: Not on file  Housing: Patient Declined (09/05/2023)   Epic    Unable to Pay for Housing in the Last Year: Patient declined    Number of Times Moved in the Last Year: Not on file    Homeless in the Last Year: Patient declined  Utilities: Not on file  Health Literacy: Not on file    Outpatient Medications Prior to Visit  Medication Sig Dispense Refill   ALPRAZolam (XANAX) 0.5 MG tablet Take 0.5 mg by mouth at bedtime as needed for sleep.     estradiol (ESTRACE VAGINAL) 0.1 MG/GM vaginal cream Place 1 Applicatorful vaginally daily.     MINOXIDIL PO Take 2.5 mg by mouth daily. 1/2 tab daily     polyethylene glycol powder (GLYCOLAX/MIRALAX) 17 GM/SCOOP powder Take 1 Container by mouth daily.     losartan  (COZAAR ) 25 MG tablet Take 1 tablet (25 mg total) by mouth daily. 90 tablet 1   DULoxetine  (CYMBALTA ) 30 MG capsule Take 1 capsule (30 mg total) by mouth daily. 30 capsule 1   No facility-administered medications prior to visit.    Allergies[1]  ROS     Objective:    Physical Exam Vitals reviewed.  Constitutional:      General: She is not in acute distress.    Appearance: She is not  toxic-appearing.  HENT:     Head: Normocephalic and atraumatic.     Mouth/Throat:     Mouth: Mucous membranes are moist.     Pharynx: Oropharynx is clear.  Eyes:     Pupils: Pupils are equal, round, and reactive to light.  Cardiovascular:     Rate and Rhythm: Normal rate and regular rhythm.     Pulses: Normal pulses.     Heart sounds: Normal heart sounds. No murmur heard. Pulmonary:     Effort: Pulmonary effort is normal.  No respiratory distress.     Breath sounds: Normal breath sounds. No wheezing.  Musculoskeletal:        General: No swelling.     Cervical back: Neck supple.  Skin:    General: Skin is warm and dry.  Neurological:     General: No focal deficit present.     Mental Status: She is alert and oriented to person, place, and time.  Psychiatric:        Mood and Affect: Mood normal.        Behavior: Behavior normal.        Thought Content: Thought content normal.        Judgment: Judgment normal.      BP 116/76 (BP Location: Left Arm, Patient Position: Sitting, Cuff Size: Normal)   Pulse 81   Temp 98 F (36.7 C) (Oral)   Resp 16   Ht 5' 2 (1.575 m)   Wt 149 lb (67.6 kg)   SpO2 95%   BMI 27.25 kg/m  Wt Readings from Last 3 Encounters:  03/04/24 149 lb (67.6 kg)  11/17/23 151 lb (68.5 kg)  10/14/23 150 lb 6.4 oz (68.2 kg)       Assessment & Plan:   Problem List Items Addressed This Visit       Cardiovascular and Mediastinum   Hemorrhoids   Chronic hemorrhoids with itching and discomfort.  - Rx- Anusol  - Advised use of Tucks OTC - Recommended prevention strategies including adequate fiber intake and avoiding straining. RTC if persist or worsens      Relevant Medications   hydrocortisone  (ANUSOL -HC) 2.5 % rectal cream   losartan  (COZAAR ) 25 MG tablet   Primary hypertension   Well controlled, no changes to meds. Encouraged heart healthy diet such as the DASH diet and exercise as tolerated.        Relevant Medications   losartan  (COZAAR ) 25  MG tablet   Other Relevant Orders   Comprehensive metabolic panel with GFR     Other   Anxiety and depression   Using Xanax for sleep as needed prn. Rx prescribed by GYN.       Arthralgia   Positive ANA, has upcoming appointment with Rheum.      Hereditary hemochromatosis - Primary   Follows with hemeonc       History of tobacco use   Pt has extensive smoking history but quit for 10 years, declines LDCT.      Vitamin D deficiency   Supplement and monitor.      Relevant Orders   Vitamin D (25 hydroxy)   Vitamin D (25 hydroxy)    General health maintenance Declined lung cancer screening CT scan and second shingles vaccine. Schedule for bone density scan and Mgm with gynecologist.  FU 6 months    I have discontinued Astryd K. Desmarais's DULoxetine . I am also having her start on hydrocortisone . Additionally, I am having her maintain her ALPRAZolam, estradiol, polyethylene glycol powder, MINOXIDIL PO, and losartan .  Meds ordered this encounter  Medications   hydrocortisone  (ANUSOL -HC) 2.5 % rectal cream    Sig: Place 1 Application rectally 2 (two) times daily.    Dispense:  30 g    Refill:  0    Supervising Provider:   DOMENICA BLACKBIRD A [[id number]]   losartan  (COZAAR ) 25 MG tablet    Sig: Take 1 tablet (25 mg total) by mouth daily.    Dispense:  90 tablet    Refill:  1    Supervising  Provider:   DOMENICA BLACKBIRD A [[id number]]      [1]  Allergies Allergen Reactions   Clindamycin/Lincomycin Hives and Other (See Comments)    Blisters/open sores all over upper trunk, face and neck   Flagyl [Metronidazole] Nausea Only and Other (See Comments)    Headaches, fatigue, felt bad when taking PILL form of Flagyl   "

## 2024-03-04 NOTE — Assessment & Plan Note (Signed)
 Follows with heme onc

## 2024-03-04 NOTE — Progress Notes (Signed)
 " Hematology and Oncology Follow Up Visit  DEANNE BEDGOOD [id number] 1955/09/02 69 y.o. 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 Laube is here today for follow-up. She is doing well over all and has not noticed any new issues.  She continues to have palpitations which she notes occur often after eating. She plans to discuss with her PCP and see about wearing a heart monitor.  No fever, chills, n/v, cough, rash, dizziness, headaches, changes in vision, SOB, chest pain, abdominal pain or changes in bowel or bladder habits.  No swelling, numbness or tingling in her extremities.  She has generalized aches and pains from the cold weather.  No falls or syncope.  Appetite and hydration are good. Weight is stable at 149 lbs.   ECOG Performance Status: 1 - Symptomatic but completely ambulatory  Medications:  Allergies as of 03/04/2024       Reactions   Clindamycin/lincomycin Hives, Other (See Comments)   Blisters/open sores all over upper trunk, face and neck   Flagyl [metronidazole] Nausea Only, Other (See Comments)   Headaches, fatigue, felt bad when taking PILL form of Flagyl        Medication List        Accurate as of March 04, 2024 10:26 AM. If you have any questions, ask your nurse or doctor.          STOP taking these medications    DULoxetine  30 MG capsule Commonly known as: Cymbalta  Stopped by: Harlene Jolly, NP       TAKE these medications    ALPRAZolam 0.5 MG tablet Commonly known as: XANAX Take 0.5 mg by mouth at bedtime as needed for sleep.   ESTRACE VAGINAL 0.1 MG/GM vaginal cream Generic drug: estradiol Place 1 Applicatorful vaginally daily.   hydrocortisone  2.5 % rectal cream Commonly known as: ANUSOL -HC Place 1 Application rectally 2 (two) times daily. Started  by: Harlene Jolly, NP   losartan  25 MG tablet Commonly known as: COZAAR  Take 1 tablet (25 mg total) by mouth daily.   MINOXIDIL PO Take 2.5 mg by mouth daily. 1/2 tab daily   polyethylene glycol powder 17 GM/SCOOP powder Commonly known as: GLYCOLAX/MIRALAX Take 1 Container by mouth daily.        Allergies: Allergies[1]  Past Medical History, Surgical history, Social history, and Family History were reviewed and updated.  Review of Systems: All other 10 point review of systems is negative.   Physical Exam:  vitals were not taken for this visit.   Wt Readings from Last 3 Encounters:  03/04/24 149 lb (67.6 kg)  11/17/23 151 lb (68.5 kg)  10/14/23 150 lb 6.4 oz (68.2 kg)    Ocular: Sclerae unicteric, pupils equal, round and reactive to light Ear-nose-throat: Oropharynx clear, dentition fair Lymphatic: No cervical or supraclavicular adenopathy Lungs no rales or rhonchi, good excursion bilaterally Heart regular rate and rhythm, no murmur appreciated Abd soft, nontender, positive bowel sounds MSK no focal spinal tenderness, no joint edema Neuro: non-focal, well-oriented, appropriate affect Breasts: Deferred   Lab Results  Component Value Date   WBC 8.2 03/04/2024   HGB 14.5 03/04/2024   HCT 41.8 03/04/2024   MCV 89.9 03/04/2024   PLT 256 03/04/2024   Lab Results  Component Value Date   FERRITIN 132 11/17/2023  IRON 111 11/17/2023   TIBC 305 11/17/2023   UIBC 194 11/17/2023   IRONPCTSAT 36 (H) 11/17/2023   Lab Results  Component Value Date   RBC 4.65 03/04/2024   No results found for: KPAFRELGTCHN, LAMBDASER, KAPLAMBRATIO No results found for: IGGSERUM, IGA, IGMSERUM No results found for: STEPHANY CARLOTA BENSON MARKEL EARLA JOANNIE DOC VICK, SPEI   Chemistry      Component Value Date/Time   NA 144 11/17/2023 0808   K 4.3 11/17/2023 0808   CL 106 11/17/2023 0808   CO2 25 11/17/2023 0808   BUN 13 11/17/2023  0808   CREATININE 0.99 11/17/2023 0808      Component Value Date/Time   CALCIUM 9.8 11/17/2023 0808   ALKPHOS 50 11/17/2023 0808   AST 20 11/17/2023 0808   ALT 10 11/17/2023 0808   BILITOT 0.9 11/17/2023 0808       Impression and Plan: Ms. Hardrick is a very pleasant 69 yo caucasian female with hemochromatosis.  Iron studies are pending. We will schedule phlebotomy if needed.  Follow-up in 3 months.  Lauraine Pepper, NP 2/6/202610:26 AM     [1]  Allergies Allergen Reactions   Clindamycin/Lincomycin Hives and Other (See Comments)    Blisters/open sores all over upper trunk, face and neck   Flagyl [Metronidazole] Nausea Only and Other (See Comments)    Headaches, fatigue, felt bad when taking PILL form of Flagyl   "

## 2024-03-04 NOTE — Assessment & Plan Note (Signed)
 Positive ANA, has upcoming appointment with Rheum.

## 2024-03-04 NOTE — Assessment & Plan Note (Signed)
 Pt has extensive smoking history but quit for 10 years, declines LDCT.

## 2024-03-04 NOTE — Assessment & Plan Note (Signed)
 Supplement and monitor

## 2024-04-12 ENCOUNTER — Ambulatory Visit

## 2024-09-01 ENCOUNTER — Encounter: Admitting: Student
